# Patient Record
Sex: Female | Born: 1975
Health system: Southern US, Community
[De-identification: ages and names within clinical notes are randomized; demographics above are authoritative.]

## PROBLEM LIST (undated history)

## (undated) DIAGNOSIS — Z87898 Personal history of other specified conditions: Secondary | ICD-10-CM

## (undated) DIAGNOSIS — R5383 Other fatigue: Secondary | ICD-10-CM

## (undated) DIAGNOSIS — B0089 Other herpesviral infection: Secondary | ICD-10-CM

## (undated) DIAGNOSIS — F419 Anxiety disorder, unspecified: Secondary | ICD-10-CM

## (undated) DIAGNOSIS — E559 Vitamin D deficiency, unspecified: Secondary | ICD-10-CM

## (undated) DIAGNOSIS — R7303 Prediabetes: Secondary | ICD-10-CM

## (undated) HISTORY — DX: Personal history of other specified conditions: Z87.898

## (undated) HISTORY — DX: Other herpesviral infection: B00.89

## (undated) HISTORY — DX: Vitamin D deficiency, unspecified: E55.9

## (undated) HISTORY — DX: Other fatigue: R53.83

## (undated) HISTORY — DX: Anxiety disorder, unspecified: F41.9

## (undated) HISTORY — DX: Prediabetes: R73.03

## (undated) HISTORY — PX: BREAST REDUCTION SURGERY: SHX8

---

## 1997-10-15 ENCOUNTER — Other Ambulatory Visit: Admission: RE | Admit: 1997-10-15 | Discharge: 1997-10-15 | Payer: Self-pay | Admitting: Obstetrics and Gynecology

## 2000-01-20 HISTORY — PX: LEEP: SHX91

## 2000-02-23 ENCOUNTER — Other Ambulatory Visit: Admission: RE | Admit: 2000-02-23 | Discharge: 2000-02-23 | Payer: Self-pay | Admitting: Internal Medicine

## 2000-03-11 ENCOUNTER — Other Ambulatory Visit: Admission: RE | Admit: 2000-03-11 | Discharge: 2000-04-02 | Payer: Self-pay

## 2000-04-01 ENCOUNTER — Other Ambulatory Visit: Admission: RE | Admit: 2000-04-01 | Discharge: 2000-04-01 | Payer: Self-pay | Admitting: *Deleted

## 2000-04-01 ENCOUNTER — Encounter (INDEPENDENT_AMBULATORY_CARE_PROVIDER_SITE_OTHER): Payer: Self-pay

## 2000-04-23 ENCOUNTER — Encounter (INDEPENDENT_AMBULATORY_CARE_PROVIDER_SITE_OTHER): Payer: Self-pay

## 2000-04-23 ENCOUNTER — Other Ambulatory Visit: Admission: RE | Admit: 2000-04-23 | Discharge: 2000-04-23 | Payer: Self-pay | Admitting: *Deleted

## 2000-07-31 ENCOUNTER — Other Ambulatory Visit: Admission: RE | Admit: 2000-07-31 | Discharge: 2000-07-31 | Payer: Self-pay | Admitting: *Deleted

## 2000-11-29 ENCOUNTER — Other Ambulatory Visit: Admission: RE | Admit: 2000-11-29 | Discharge: 2000-11-29 | Payer: Self-pay | Admitting: Obstetrics & Gynecology

## 2001-04-18 ENCOUNTER — Other Ambulatory Visit: Admission: RE | Admit: 2001-04-18 | Discharge: 2001-04-18 | Payer: Self-pay | Admitting: Obstetrics & Gynecology

## 2001-07-06 ENCOUNTER — Other Ambulatory Visit: Admission: RE | Admit: 2001-07-06 | Discharge: 2001-07-06 | Payer: Self-pay | Admitting: Obstetrics & Gynecology

## 2001-11-17 ENCOUNTER — Other Ambulatory Visit: Admission: RE | Admit: 2001-11-17 | Discharge: 2001-11-17 | Payer: Self-pay | Admitting: Obstetrics & Gynecology

## 2002-01-08 ENCOUNTER — Inpatient Hospital Stay (HOSPITAL_COMMUNITY): Admission: AD | Admit: 2002-01-08 | Discharge: 2002-01-08 | Payer: Self-pay | Admitting: Obstetrics & Gynecology

## 2002-01-15 ENCOUNTER — Inpatient Hospital Stay (HOSPITAL_COMMUNITY): Admission: AD | Admit: 2002-01-15 | Discharge: 2002-01-17 | Payer: Self-pay | Admitting: Obstetrics and Gynecology

## 2002-02-16 ENCOUNTER — Other Ambulatory Visit: Admission: RE | Admit: 2002-02-16 | Discharge: 2002-02-16 | Payer: Self-pay | Admitting: Obstetrics & Gynecology

## 2002-11-28 ENCOUNTER — Other Ambulatory Visit: Admission: RE | Admit: 2002-11-28 | Discharge: 2002-11-28 | Payer: Self-pay | Admitting: Obstetrics & Gynecology

## 2003-05-21 ENCOUNTER — Other Ambulatory Visit: Admission: RE | Admit: 2003-05-21 | Discharge: 2003-05-21 | Payer: Self-pay | Admitting: *Deleted

## 2003-12-18 ENCOUNTER — Ambulatory Visit (HOSPITAL_COMMUNITY): Admission: RE | Admit: 2003-12-18 | Discharge: 2003-12-18 | Payer: Self-pay | Admitting: Obstetrics & Gynecology

## 2003-12-19 ENCOUNTER — Inpatient Hospital Stay (HOSPITAL_COMMUNITY): Admission: AD | Admit: 2003-12-19 | Discharge: 2003-12-22 | Payer: Self-pay | Admitting: Obstetrics and Gynecology

## 2005-02-10 ENCOUNTER — Other Ambulatory Visit: Admission: RE | Admit: 2005-02-10 | Discharge: 2005-02-10 | Payer: Self-pay | Admitting: Internal Medicine

## 2006-02-25 ENCOUNTER — Other Ambulatory Visit: Admission: RE | Admit: 2006-02-25 | Discharge: 2006-02-25 | Payer: Self-pay | Admitting: Internal Medicine

## 2007-07-28 ENCOUNTER — Other Ambulatory Visit: Admission: RE | Admit: 2007-07-28 | Discharge: 2007-07-28 | Payer: Self-pay | Admitting: Internal Medicine

## 2008-01-16 ENCOUNTER — Ambulatory Visit: Payer: Self-pay | Admitting: Internal Medicine

## 2008-08-26 ENCOUNTER — Inpatient Hospital Stay (HOSPITAL_COMMUNITY): Admission: AD | Admit: 2008-08-26 | Discharge: 2008-08-26 | Payer: Self-pay | Admitting: Obstetrics

## 2008-09-28 ENCOUNTER — Inpatient Hospital Stay (HOSPITAL_COMMUNITY): Admission: AD | Admit: 2008-09-28 | Discharge: 2008-09-28 | Payer: Self-pay | Admitting: Obstetrics & Gynecology

## 2008-10-07 ENCOUNTER — Inpatient Hospital Stay (HOSPITAL_COMMUNITY): Admission: AD | Admit: 2008-10-07 | Discharge: 2008-10-10 | Payer: Self-pay | Admitting: Obstetrics & Gynecology

## 2008-10-11 ENCOUNTER — Encounter: Admission: RE | Admit: 2008-10-11 | Discharge: 2008-11-09 | Payer: Self-pay | Admitting: Obstetrics & Gynecology

## 2008-11-10 ENCOUNTER — Encounter: Admission: RE | Admit: 2008-11-10 | Discharge: 2008-12-10 | Payer: Self-pay | Admitting: Obstetrics & Gynecology

## 2008-12-11 ENCOUNTER — Encounter: Admission: RE | Admit: 2008-12-11 | Discharge: 2009-01-09 | Payer: Self-pay | Admitting: Obstetrics & Gynecology

## 2009-01-10 ENCOUNTER — Encounter: Admission: RE | Admit: 2009-01-10 | Discharge: 2009-01-17 | Payer: Self-pay | Admitting: Obstetrics & Gynecology

## 2009-02-10 ENCOUNTER — Encounter: Admission: RE | Admit: 2009-02-10 | Discharge: 2009-03-12 | Payer: Self-pay | Admitting: Obstetrics & Gynecology

## 2009-03-13 ENCOUNTER — Encounter: Admission: RE | Admit: 2009-03-13 | Discharge: 2009-04-12 | Payer: Self-pay | Admitting: Obstetrics & Gynecology

## 2009-05-11 ENCOUNTER — Encounter: Admission: RE | Admit: 2009-05-11 | Discharge: 2009-06-10 | Payer: Self-pay | Admitting: Obstetrics & Gynecology

## 2009-06-11 ENCOUNTER — Encounter: Admission: RE | Admit: 2009-06-11 | Discharge: 2009-07-11 | Payer: Self-pay | Admitting: Obstetrics & Gynecology

## 2009-07-12 ENCOUNTER — Encounter: Admission: RE | Admit: 2009-07-12 | Discharge: 2009-07-18 | Payer: Self-pay | Admitting: Obstetrics & Gynecology

## 2009-08-12 ENCOUNTER — Encounter: Admission: RE | Admit: 2009-08-12 | Discharge: 2009-09-11 | Payer: Self-pay | Admitting: Obstetrics & Gynecology

## 2009-09-12 ENCOUNTER — Encounter: Admission: RE | Admit: 2009-09-12 | Discharge: 2009-10-08 | Payer: Self-pay | Admitting: Obstetrics & Gynecology

## 2010-01-24 ENCOUNTER — Ambulatory Visit
Admission: RE | Admit: 2010-01-24 | Discharge: 2010-01-24 | Payer: Self-pay | Source: Home / Self Care | Attending: Internal Medicine | Admitting: Internal Medicine

## 2010-04-25 LAB — CBC
HCT: 31.2 % — ABNORMAL LOW (ref 36.0–46.0)
HCT: 37.2 % (ref 36.0–46.0)
Hemoglobin: 10.8 g/dL — ABNORMAL LOW (ref 12.0–15.0)
Hemoglobin: 12.5 g/dL (ref 12.0–15.0)
MCHC: 33.6 g/dL (ref 30.0–36.0)
MCHC: 34.7 g/dL (ref 30.0–36.0)
MCV: 95 fL (ref 78.0–100.0)
MCV: 95.7 fL (ref 78.0–100.0)
Platelets: 150 10*3/uL (ref 150–400)
Platelets: 187 10*3/uL (ref 150–400)
RBC: 3.26 MIL/uL — ABNORMAL LOW (ref 3.87–5.11)
RBC: 3.92 MIL/uL (ref 3.87–5.11)
RDW: 13.9 % (ref 11.5–15.5)
RDW: 14.2 % (ref 11.5–15.5)
WBC: 10.2 10*3/uL (ref 4.0–10.5)
WBC: 11.1 10*3/uL — ABNORMAL HIGH (ref 4.0–10.5)

## 2010-04-25 LAB — RPR: RPR Ser Ql: NONREACTIVE

## 2010-04-26 LAB — POCT PREGNANCY, URINE: Preg Test, Ur: POSITIVE

## 2010-05-05 ENCOUNTER — Encounter (INDEPENDENT_AMBULATORY_CARE_PROVIDER_SITE_OTHER): Payer: 59 | Admitting: Internal Medicine

## 2010-05-05 DIAGNOSIS — Z0000 Encounter for general adult medical examination without abnormal findings: Secondary | ICD-10-CM

## 2013-01-24 ENCOUNTER — Ambulatory Visit (INDEPENDENT_AMBULATORY_CARE_PROVIDER_SITE_OTHER): Payer: BC Managed Care – PPO | Admitting: Internal Medicine

## 2013-01-24 ENCOUNTER — Encounter: Payer: Self-pay | Admitting: Internal Medicine

## 2013-01-24 VITALS — BP 116/82 | HR 84 | Temp 100.5°F | Ht 64.0 in | Wt 164.0 lb

## 2013-01-24 DIAGNOSIS — B349 Viral infection, unspecified: Secondary | ICD-10-CM

## 2013-01-24 DIAGNOSIS — J209 Acute bronchitis, unspecified: Secondary | ICD-10-CM

## 2013-01-24 DIAGNOSIS — B9789 Other viral agents as the cause of diseases classified elsewhere: Secondary | ICD-10-CM

## 2013-01-24 MED ORDER — HYDROCODONE-HOMATROPINE 5-1.5 MG/5ML PO SYRP: 5.0000 mL | ORAL_SOLUTION | Freq: Three times a day (TID) | ORAL | Status: DC | PRN

## 2013-01-24 MED ORDER — AZITHROMYCIN 250 MG PO TABS: ORAL_TABLET | ORAL | Status: DC

## 2013-01-24 NOTE — Patient Instructions (Signed)
Take Z-Pak as directed. Take Hycodan 1 teaspoon every 8 hours as needed for cough.

## 2013-01-24 NOTE — Progress Notes (Signed)
   Subjective:    Patient ID: Brooke Bryan, female    DOB: 10/01/1975, 38 y.o.   MRN: [id number]  HPI Patient not seen since April 2012. She's been a patient here since she was 38 years old. Her daughter and 2 sons and had influenza symptoms. 36 year old son has had pneumonia. Patient has come down with flulike symptoms on January 2 with chills and myalgias. Has developed cough and congestion. Currently not on oral contraceptives. Has GYN physician. No documented fever at home. Children have been on Tamiflu. 2 sons tested positive for influenza A. Youngest son had had flu mist. Patient does not smoke. No chronic illnesses.    Review of Systems     Objective:   Physical Exam Skin: Pale warm and dry. Nodes none. Pharynx slightly injected. TMs are clear. Neck is supple without adenopathy. Chest clear.        Assessment & Plan:  Acute bronchitis  Viral syndrome-likely has had influenza A given exposure to her children. She did not take influenza immunization.  Plan: Zithromax Z-Pak take 2 tablets day one followed by 1 tablet days 2 through 5. Hycodan 8 ounces 1 teaspoon by mouth every 8 hours when necessary cough. Call if not better in 10 days.

## 2014-06-05 ENCOUNTER — Encounter: Payer: Self-pay | Admitting: Internal Medicine

## 2014-06-05 ENCOUNTER — Ambulatory Visit (INDEPENDENT_AMBULATORY_CARE_PROVIDER_SITE_OTHER): Payer: BLUE CROSS/BLUE SHIELD | Admitting: Internal Medicine

## 2014-06-05 VITALS — BP 112/78 | HR 72 | Temp 98.1°F | Ht 64.0 in | Wt 164.5 lb

## 2014-06-05 DIAGNOSIS — H8110 Benign paroxysmal vertigo, unspecified ear: Secondary | ICD-10-CM | POA: Diagnosis not present

## 2014-06-05 MED ORDER — AZITHROMYCIN 250 MG PO TABS: ORAL_TABLET | ORAL | Status: DC

## 2014-06-05 NOTE — Progress Notes (Signed)
   Subjective:    Patient ID: Brooke Bryan, female    DOB: 09/14/1975, 39 y.o.   MRN: [id number]  HPI  39 year old Female in today with complaint of URI symptoms around April 21. Since then has felt dizzy at times. Seems to be triggered by riding in an elevator or sometimes for no good reason. Feels like she is on a boat. She is concerned because her grandmother has a history of Mnire's disease. She has no ringing in her ears. No hearing loss. Ears do not feel stopped up but pop a little bit.    Review of Systems     Objective:   Physical Exam  TM slightly dull bilaterally but not red or full. Pharynx is clear. Neck is supple. Chest clear to auscultation. No nystagmus.      Assessment & Plan:  Probable benign positional vertigo which could've been aggravated by recent upper respiratory infection  Plan: Patient purchase over-the-counter meclizine 25 mg and take it at bedtime for week. Prescribed Zithromax Z-Pak.

## 2014-06-05 NOTE — Patient Instructions (Signed)
Take meclizine 25 mg at bedtime for one week. Take Zithromax Z-Pak. Call if symptoms persist after 2 weeks.

## 2014-07-12 ENCOUNTER — Other Ambulatory Visit: Payer: Self-pay | Admitting: Internal Medicine

## 2014-07-12 ENCOUNTER — Other Ambulatory Visit: Payer: BLUE CROSS/BLUE SHIELD | Admitting: Internal Medicine

## 2014-07-12 DIAGNOSIS — Z0000 Encounter for general adult medical examination without abnormal findings: Secondary | ICD-10-CM

## 2014-07-12 DIAGNOSIS — Z1321 Encounter for screening for nutritional disorder: Secondary | ICD-10-CM

## 2014-07-12 DIAGNOSIS — Z1329 Encounter for screening for other suspected endocrine disorder: Secondary | ICD-10-CM

## 2014-07-12 DIAGNOSIS — Z13220 Encounter for screening for lipoid disorders: Secondary | ICD-10-CM

## 2014-07-12 DIAGNOSIS — Z130 Encounter for screening for diseases of the blood and blood-forming organs and certain disorders involving the immune mechanism: Secondary | ICD-10-CM

## 2014-07-12 LAB — CBC WITH DIFFERENTIAL/PLATELET
Basophils Absolute: 0.1 10*3/uL (ref 0.0–0.1)
Basophils Relative: 1 % (ref 0–1)
Eosinophils Absolute: 0.1 10*3/uL (ref 0.0–0.7)
Eosinophils Relative: 1 % (ref 0–5)
HCT: 42.2 % (ref 36.0–46.0)
Hemoglobin: 14 g/dL (ref 12.0–15.0)
Lymphocytes Relative: 37 % (ref 12–46)
Lymphs Abs: 2.6 10*3/uL (ref 0.7–4.0)
MCH: 29.4 pg (ref 26.0–34.0)
MCHC: 33.2 g/dL (ref 30.0–36.0)
MCV: 88.5 fL (ref 78.0–100.0)
MPV: 9.6 fL (ref 8.6–12.4)
Monocytes Absolute: 0.4 10*3/uL (ref 0.1–1.0)
Monocytes Relative: 6 % (ref 3–12)
Neutro Abs: 3.9 10*3/uL (ref 1.7–7.7)
Neutrophils Relative %: 55 % (ref 43–77)
Platelets: 235 10*3/uL (ref 150–400)
RBC: 4.77 MIL/uL (ref 3.87–5.11)
RDW: 13.5 % (ref 11.5–15.5)
WBC: 7 10*3/uL (ref 4.0–10.5)

## 2014-07-12 LAB — COMPLETE METABOLIC PANEL WITH GFR
ALT: 12 U/L (ref 0–35)
AST: 15 U/L (ref 0–37)
Albumin: 4.5 g/dL (ref 3.5–5.2)
Alkaline Phosphatase: 56 U/L (ref 39–117)
BUN: 15 mg/dL (ref 6–23)
CO2: 27 mEq/L (ref 19–32)
Calcium: 9.4 mg/dL (ref 8.4–10.5)
Chloride: 102 mEq/L (ref 96–112)
Creat: 0.7 mg/dL (ref 0.50–1.10)
GFR, Est African American: >89 mL/min
GFR, Est Non African American: >89 mL/min
Glucose, Bld: 93 mg/dL (ref 70–99)
Potassium: 4.5 mEq/L (ref 3.5–5.3)
Sodium: 137 mEq/L (ref 135–145)
Total Bilirubin: 0.7 mg/dL (ref 0.2–1.2)
Total Protein: 7.3 g/dL (ref 6.0–8.3)

## 2014-07-12 LAB — LIPID PANEL
Cholesterol: 193 mg/dL (ref 0–200)
HDL: 59 mg/dL (ref >=46)
LDL Cholesterol: 96 mg/dL (ref 0–99)
Total CHOL/HDL Ratio: 3.3 Ratio
Triglycerides: 188 mg/dL — ABNORMAL HIGH (ref <150)
VLDL: 38 mg/dL (ref 0–40)

## 2014-07-12 LAB — TSH: TSH: 1.634 u[IU]/mL (ref 0.350–4.500)

## 2014-07-13 LAB — VITAMIN D 25 HYDROXY (VIT D DEFICIENCY, FRACTURES): Vit D, 25-Hydroxy: 24 ng/mL — ABNORMAL LOW (ref 30–100)

## 2014-07-17 ENCOUNTER — Ambulatory Visit (INDEPENDENT_AMBULATORY_CARE_PROVIDER_SITE_OTHER): Payer: BLUE CROSS/BLUE SHIELD | Admitting: Internal Medicine

## 2014-07-17 ENCOUNTER — Encounter: Payer: Self-pay | Admitting: Internal Medicine

## 2014-07-17 VITALS — BP 108/72 | HR 88 | Temp 98.2°F | Ht 64.0 in | Wt 169.0 lb

## 2014-07-17 DIAGNOSIS — Z0000 Encounter for general adult medical examination without abnormal findings: Secondary | ICD-10-CM

## 2014-07-17 DIAGNOSIS — Z9889 Other specified postprocedural states: Secondary | ICD-10-CM | POA: Diagnosis not present

## 2014-07-17 DIAGNOSIS — E781 Pure hyperglyceridemia: Secondary | ICD-10-CM | POA: Diagnosis not present

## 2014-07-17 DIAGNOSIS — E669 Obesity, unspecified: Secondary | ICD-10-CM | POA: Diagnosis not present

## 2014-07-17 DIAGNOSIS — B009 Herpesviral infection, unspecified: Secondary | ICD-10-CM

## 2014-07-17 LAB — POCT URINALYSIS DIPSTICK
Bilirubin, UA: NEGATIVE
Blood, UA: NEGATIVE
Glucose, UA: NEGATIVE
Ketones, UA: NEGATIVE
Leukocytes, UA: NEGATIVE
Nitrite, UA: NEGATIVE
Protein, UA: NEGATIVE
Spec Grav, UA: <=1.005
Urobilinogen, UA: NEGATIVE
pH, UA: 8

## 2014-07-17 LAB — HEMOGLOBIN A1C
Hgb A1c MFr Bld: 5.7 % — ABNORMAL HIGH (ref <5.7)
Mean Plasma Glucose: 117 mg/dL — ABNORMAL HIGH (ref <117)

## 2014-07-17 NOTE — Progress Notes (Signed)
   Subjective:    Patient ID: Brooke Bryan, female    DOB: 09/10/1975, 39 y.o.   MRN: [id number]  HPI 39 year old white female has been a patient in this practice since she was 39 years old. History of chickenpox 61986. Recurrent urinary tract infections in childhood. LEEP procedure by Dr. Delia HeadyWes Bryan in 2002 for abnormal Pap smear. History of HSV type II.  Dr. Seymour Bryan is her GYN physician. Had tetanus immunization is GYN in 2011.  Had episode of vertigo May 2016 that resolved with conservative therapy.  No known drug allergies  Social history: She is married. She attended [employer] and majored in Chief Financial Officermarketing. Her father previously on try gone. It was old in 2009. She was laid-off and subsequently began working in [employer] for [employer]. 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 Does not smoke. Social alcohol consumption. She has 3 children. Son has been diagnosed with translocation of chromosomes 1 and 2427. Apparently Brooke Bryan's husband has a balanced translocation. One daughter age 37. Sons are ages 3212 and 955.  Family history: Father has been diagnosed with hyperlipidemia and hypertension. Father  had hip replacement. One brother in good health. Mother living in good health.  Patient is overweight. Doesn't get as much exercise as she would like to.    Review of Systems  Constitutional: Negative.   All other systems reviewed and are negative.      Objective:   Physical Exam  Constitutional: She is oriented to person, place, and time. She appears well-developed and well-nourished. No distress.  HENT:  Head: Normocephalic and atraumatic.  Right Ear: External ear normal.  Left Ear: External ear normal.  Mouth/Throat: Oropharynx is clear and moist. No oropharyngeal exudate.  Eyes: Conjunctivae and EOM are normal. Pupils are equal, round, and reactive to light. Right eye exhibits no discharge. Left eye exhibits no discharge. No scleral  icterus.  Neck: Normal range of motion. Neck supple. No JVD present. No thyromegaly present.  Cardiovascular: Normal rate, regular rhythm and normal heart sounds.   No murmur heard. Pulmonary/Chest: Effort normal and breath sounds normal. She has no wheezes. She has no rales.  Breasts normal female without masses  Abdominal: Soft. Bowel sounds are normal. She exhibits no distension and no mass. There is no tenderness. There is no rebound and no guarding.  Genitourinary:  Deferred to GYN  Musculoskeletal: She exhibits no edema.  Neurological: She is alert and oriented to person, place, and time. She has normal reflexes. No cranial nerve deficit.  Skin: Skin is warm and dry. No rash noted. She is not diaphoretic.  Psychiatric: She has a normal mood and affect. Her behavior is normal. Thought content normal.  Vitals reviewed.         Assessment & Plan:  Obesity-she tried phentermine sometime back and lost weight but then regained it. Did not like the way it made her feel. Were going to try Tenuate 75 mg 1 by mouth at 10 AM as an appetite suppressant. Given #30 with 3 refills.  Hypertriglyceridemia-needs to diet exercise and lose weight and recheck in one year. Triglycerides are 188. Screen for diabetes with elevated triglycerides. Hemoglobin A1c pending.  Health maintenance-tetanus immunization up-to-date. To see GYN physician soon.  History of LEEP procedure for abnormal Pap smear  History of HSV type II  Plan: Return in one year or as needed. Tenuate is appetite suppressant. Encouraged diet exercise and weight loss.

## 2014-07-17 NOTE — Patient Instructions (Addendum)
It was a pleasure to see you today. Please try to diet and exercise. Triglycerides are elevated at 188. Tetanus immunization is up-to-date having been given in 2011. Return in one year or as needed. Try Tenuate for appetite suppression.

## 2014-07-18 ENCOUNTER — Telehealth: Payer: Self-pay | Admitting: *Deleted

## 2014-08-01 NOTE — Telephone Encounter (Signed)
Have made numerous attempts to contact patient to review lab work . Patient has not returned calls.

## 2015-01-10 ENCOUNTER — Encounter: Payer: Self-pay | Admitting: Internal Medicine

## 2015-01-10 ENCOUNTER — Ambulatory Visit (INDEPENDENT_AMBULATORY_CARE_PROVIDER_SITE_OTHER): Payer: BLUE CROSS/BLUE SHIELD | Admitting: Internal Medicine

## 2015-01-10 VITALS — BP 112/78 | HR 104 | Temp 99.7°F | Resp 20 | Ht 64.0 in | Wt 165.0 lb

## 2015-01-10 DIAGNOSIS — R509 Fever, unspecified: Secondary | ICD-10-CM | POA: Diagnosis not present

## 2015-01-10 DIAGNOSIS — J029 Acute pharyngitis, unspecified: Secondary | ICD-10-CM | POA: Diagnosis not present

## 2015-01-10 LAB — POCT RAPID STREP A (OFFICE): Rapid Strep A Screen: NEGATIVE

## 2015-01-10 MED ORDER — CLARITHROMYCIN 500 MG PO TABS: 500.0000 mg | ORAL_TABLET | Freq: Two times a day (BID) | ORAL | Status: DC

## 2015-01-10 MED ORDER — HYDROCODONE-ACETAMINOPHEN 5-325 MG PO TABS: 1.0000 | ORAL_TABLET | Freq: Four times a day (QID) | ORAL | Status: DC | PRN

## 2015-01-10 NOTE — Patient Instructions (Addendum)
Take Norco as needed for sore throat pain. Take Biaxin 500 mg twice daily x 10 days.

## 2015-01-10 NOTE — Progress Notes (Signed)
   Subjective:    Patient ID: Brooke Bryan, female    DOB: 04/13/1975, 39 y.o.   MRN: [id number]  HPI 39 year old Female in today with sore throat and fever and congestion. Children have been ill. Has malaise and fatigue. Some cough.    Review of Systems     Objective:   Physical Exam  Skin warm and dry. Nodes none. Pharynx injected. No exudate noted. TMs are slightly full and pink bilaterally. Neck is supple. Chest clear to auscultation. Rapid strep screen is negative.      Assessment & Plan:  Acute pharyngitis  Fever  Plan: Biaxin 500 mg twice daily for 10 days. Take with food. Norco 5/325 one by mouth every 8 hours when necessary sore throat pain. Rest and drink plenty of fluids.

## 2015-01-16 ENCOUNTER — Encounter: Payer: Self-pay | Admitting: Internal Medicine

## 2015-04-10 ENCOUNTER — Telehealth: Payer: Self-pay

## 2015-04-10 NOTE — Telephone Encounter (Signed)
Patient contacted office stating that she has been seen by you before for her feeling off balance (swaying like she is on a boat). She states that when she takes dramamine for it that it knocks her out and she cannot function. She states that she is not nauseated with it and doesn't feel like she is going to fall. She is hesitant to use the term dizzy as she is not dizzy persay, just more off balance. She reports a "chilled" sensation up her scalp at times. She wonders if she needs to be evaluated at ENT or with you. I have penciled her in for tomorrow at 3:45 pending your advisement.

## 2015-04-10 NOTE — Telephone Encounter (Signed)
OK  To see tomorrow--she was seen May 2016 for vertigo associated with URI  and in December for pharyngitis with no mention of vertigo

## 2015-04-11 ENCOUNTER — Ambulatory Visit: Payer: BLUE CROSS/BLUE SHIELD | Admitting: Internal Medicine

## 2015-04-12 ENCOUNTER — Encounter: Payer: Self-pay | Admitting: Internal Medicine

## 2015-04-12 ENCOUNTER — Ambulatory Visit (INDEPENDENT_AMBULATORY_CARE_PROVIDER_SITE_OTHER): Payer: Managed Care, Other (non HMO) | Admitting: Internal Medicine

## 2015-04-12 VITALS — Temp 98.0°F | Resp 18 | Ht 64.0 in | Wt 172.0 lb

## 2015-04-12 DIAGNOSIS — H8110 Benign paroxysmal vertigo, unspecified ear: Secondary | ICD-10-CM | POA: Diagnosis not present

## 2015-04-12 MED ORDER — ALPRAZOLAM 0.25 MG PO TABS: 0.2500 mg | ORAL_TABLET | Freq: Two times a day (BID) | ORAL | Status: DC | PRN

## 2015-04-12 MED ORDER — AMOXICILLIN 500 MG PO CAPS: 500.0000 mg | ORAL_CAPSULE | Freq: Three times a day (TID) | ORAL | Status: DC

## 2015-04-12 MED ORDER — METHYLPREDNISOLONE ACETATE 80 MG/ML IJ SUSP
80.0000 mg | Freq: Once | INTRAMUSCULAR | Status: AC
  Administered 2015-04-12: 80 mg via INTRAMUSCULAR

## 2015-04-15 ENCOUNTER — Telehealth: Payer: Self-pay

## 2015-04-15 NOTE — Telephone Encounter (Signed)
Patient contacted office and states that she feels like the dizziness is worse. She cannot take the xanax due to it making her very drowsy. She is taking the amoxicillin and she states that if anything her ear is a little worse. She states that she feels like the rocking is worse. CB# [phone]

## 2015-04-15 NOTE — Telephone Encounter (Signed)
Try breaking Xanax in half. May need to stay home from work. See if ENT can see.

## 2015-04-17 NOTE — Patient Instructions (Addendum)
Takes Xanax 0.25 mg twice daily until symptoms resolve.

## 2015-04-17 NOTE — Progress Notes (Signed)
   Subjective:    Patient ID: Carmela RimaJessica W Garbett, female    DOB: 02/15/1975, 40 y.o.   MRN: [id number]  HPI 40 year old White Female in today for complaint of dizziness which she describes as feeling like she is rocking on a boat. Symptoms been present for several days. She tried meclizine but that made her quite drowsy. No nausea and vomiting. Has some situational stress with job. Not enough help at her job. She works in Therapist, sportsproperty management. Does not smoke. Social alcohol consumption. She has 3 children. Son has been diagnosed with translocation of chromosomes 1 and 4227. Admits that she could be anxious.    Review of Systems     Objective:   Physical Exam  Blood pressure lying is 122/70 with pulse of 70 blood pressure sitting is 122/72 with pulse of 81 blood pressure standing is 122/72 with pulse of 92. She is afebrile. No nystagmus. Funduscopic exam is benign. PERRLA. Muscle strength and deep tendon reflexes normal. Gait is normal. TMs are clear. Pharynx is clear. Neck is supple without thyromegaly. Chest clear to auscultation. Cardiac exam regular rate and rhythm normal S1 and S2. No focal deficits on brief neurological exam      Assessment & Plan:  Probable benign positional vertigo  Plan: Since meclizine is causing a lot of sedation, suggest she try small dose of Xanax 0.25 mg twice daily. She was reassured symptoms will likely go away within 10 days. Xanax will also help with anxiety.

## 2015-08-05 ENCOUNTER — Ambulatory Visit (INDEPENDENT_AMBULATORY_CARE_PROVIDER_SITE_OTHER): Payer: Managed Care, Other (non HMO) | Admitting: Internal Medicine

## 2015-08-05 ENCOUNTER — Encounter: Payer: Self-pay | Admitting: Internal Medicine

## 2015-08-05 VITALS — BP 124/74 | HR 98 | Temp 99.5°F | Resp 16 | Ht 64.0 in | Wt 172.5 lb

## 2015-08-05 DIAGNOSIS — J029 Acute pharyngitis, unspecified: Secondary | ICD-10-CM | POA: Diagnosis not present

## 2015-08-05 DIAGNOSIS — J020 Streptococcal pharyngitis: Secondary | ICD-10-CM

## 2015-08-05 MED ORDER — HYDROCODONE-HOMATROPINE 5-1.5 MG/5ML PO SYRP: 5.0000 mL | ORAL_SOLUTION | Freq: Three times a day (TID) | ORAL | Status: DC | PRN

## 2015-08-05 MED ORDER — CEFTRIAXONE SODIUM 1 G IJ SOLR
1.0000 g | INTRAMUSCULAR | Status: AC
  Administered 2015-08-05: 1 g via INTRAMUSCULAR

## 2015-08-05 NOTE — Patient Instructions (Signed)
Continue Pen-Vee K to complete ten-day course. 1 g IM Rocephin given today. Hycodan 1 teaspoon by mouth every 8 hours with food when necessary pain and headache. Call if not better by Wednesday, July 19.

## 2015-08-05 NOTE — Progress Notes (Signed)
   Subjective:    Patient ID: Brooke Bryan, female    DOB: 03/24/1975, 40 y.o.   MRN: [id number]  HPI 40 year old White Female in today having been diagnosed with strep throat at an urgent care on Friday evening July 14. Was treated with Pen-Vee K. Says she's not gotten a lot better. Has headache and sore throat and malaise and fatigue. Her son was just found to be a Bryan of strep. She's been compliant with her medication.    Review of Systems see above     Objective:   Physical Exam  Skin warm and dry. She has exudates on both tonsils. TMs are slightly dull bilaterally but not red. Neck is supple. Chest clear to auscultation. No significant anterior cervical adenopathy.      Assessment & Plan:  Exudative pharyngitis-diagnosed with strep  Plan: Continue with Pen-Vee K to complete ten-day course. Call if not better by Wednesday, July 19. 1 g IM Rocephin given today. Hycodan 1 teaspoon by mouth every 8 hours when necessary headache and pain. Patient requests liquid instead of tablets for pain. Rest and drink plenty of fluids.

## 2016-04-10 ENCOUNTER — Ambulatory Visit (INDEPENDENT_AMBULATORY_CARE_PROVIDER_SITE_OTHER): Payer: Managed Care, Other (non HMO) | Admitting: Internal Medicine

## 2016-04-10 ENCOUNTER — Encounter: Payer: Self-pay | Admitting: Internal Medicine

## 2016-04-10 VITALS — BP 102/88 | HR 97 | Temp 101.8°F | Ht 64.0 in | Wt 176.0 lb

## 2016-04-10 DIAGNOSIS — R509 Fever, unspecified: Secondary | ICD-10-CM | POA: Diagnosis not present

## 2016-04-10 DIAGNOSIS — R52 Pain, unspecified: Secondary | ICD-10-CM | POA: Diagnosis not present

## 2016-04-10 DIAGNOSIS — J029 Acute pharyngitis, unspecified: Secondary | ICD-10-CM

## 2016-04-10 DIAGNOSIS — J020 Streptococcal pharyngitis: Secondary | ICD-10-CM | POA: Diagnosis not present

## 2016-04-10 DIAGNOSIS — R6883 Chills (without fever): Secondary | ICD-10-CM

## 2016-04-10 LAB — POCT RAPID STREP A (OFFICE): Rapid Strep A Screen: POSITIVE — AB

## 2016-04-10 MED ORDER — CEFTRIAXONE SODIUM 1 G IJ SOLR
1.0000 g | Freq: Once | INTRAMUSCULAR | Status: AC
  Administered 2016-04-10: 1 g via INTRAMUSCULAR

## 2016-04-10 MED ORDER — AMOXICILLIN-POT CLAVULANATE 875-125 MG PO TABS: 1.0000 | ORAL_TABLET | Freq: Two times a day (BID) | ORAL | 0 refills | Status: DC

## 2016-04-10 MED ORDER — HYDROCODONE-HOMATROPINE 5-1.5 MG/5ML PO SYRP: 5.0000 mL | ORAL_SOLUTION | Freq: Three times a day (TID) | ORAL | 0 refills | Status: DC | PRN

## 2016-04-10 NOTE — Progress Notes (Signed)
   Subjective:    Patient ID: Brooke Bryan, female    DOB: 01/18/1976, 41 y.o.   MRN: [id number]  HPI 41 year old Female in today with sore throat symptoms. She's had chills fever and myalgias. She had E visit recently through her insurance company. Dr. placed her on amoxicillin 875 mg twice daily. Date of  prescription in Epic is March 21. It's been about 2- 3 days or so and she hasn't gotten better. Says her son was diagnosed as being a strep carrier previously.          Review of Systems see above     Objective:   Physical Exam She has bilateral exudative pharyngitis both tonsils. Rapid strep screen is positive. TMs are full and pink. Neck is supple. No significant adenopathy. Chest clear to auscultation.       Assessment & Plan:  Streptococcal pharyngitis  Plan: Hycodan 1 teaspoon by mouth every 8 hours when necessary cough and sore throat pain. Augmentin 875 mg twice daily for 7 days. Rocephin 1 g IM.

## 2016-04-10 NOTE — Patient Instructions (Signed)
Complete seven-day course of Augmentin as previously prescribed by e- visit  Physician. You have one refill on this medication. Rocephin 1 g IM given. Hycodan 1 teaspoon by mouth every 8 hours when necessary sore throat pain/cough. Tylenol as needed for headache or fever.

## 2016-04-13 ENCOUNTER — Telehealth: Payer: Self-pay

## 2016-06-03 NOTE — Telephone Encounter (Signed)
ERROR

## 2016-09-14 ENCOUNTER — Ambulatory Visit: Payer: Managed Care, Other (non HMO) | Admitting: Internal Medicine

## 2016-10-09 ENCOUNTER — Other Ambulatory Visit: Payer: Managed Care, Other (non HMO) | Admitting: Internal Medicine

## 2016-10-13 ENCOUNTER — Encounter: Payer: Managed Care, Other (non HMO) | Admitting: Internal Medicine

## 2017-01-06 ENCOUNTER — Other Ambulatory Visit: Payer: Self-pay | Admitting: Internal Medicine

## 2017-01-06 DIAGNOSIS — Z1321 Encounter for screening for nutritional disorder: Secondary | ICD-10-CM

## 2017-01-06 DIAGNOSIS — Z1329 Encounter for screening for other suspected endocrine disorder: Secondary | ICD-10-CM

## 2017-01-06 DIAGNOSIS — Z0000 Encounter for general adult medical examination without abnormal findings: Secondary | ICD-10-CM

## 2017-01-06 DIAGNOSIS — E781 Pure hyperglyceridemia: Secondary | ICD-10-CM

## 2017-01-14 ENCOUNTER — Other Ambulatory Visit: Payer: Managed Care, Other (non HMO) | Admitting: Internal Medicine

## 2017-01-14 DIAGNOSIS — E781 Pure hyperglyceridemia: Secondary | ICD-10-CM

## 2017-01-14 DIAGNOSIS — Z1329 Encounter for screening for other suspected endocrine disorder: Secondary | ICD-10-CM

## 2017-01-14 DIAGNOSIS — Z1321 Encounter for screening for nutritional disorder: Secondary | ICD-10-CM

## 2017-01-14 DIAGNOSIS — Z0000 Encounter for general adult medical examination without abnormal findings: Secondary | ICD-10-CM

## 2017-01-15 ENCOUNTER — Ambulatory Visit (INDEPENDENT_AMBULATORY_CARE_PROVIDER_SITE_OTHER): Payer: Managed Care, Other (non HMO) | Admitting: Internal Medicine

## 2017-01-15 ENCOUNTER — Encounter: Payer: Self-pay | Admitting: Internal Medicine

## 2017-01-15 ENCOUNTER — Encounter (INDEPENDENT_AMBULATORY_CARE_PROVIDER_SITE_OTHER): Payer: Self-pay

## 2017-01-15 VITALS — BP 120/80 | HR 92 | Ht 63.0 in | Wt 187.0 lb

## 2017-01-15 DIAGNOSIS — Z803 Family history of malignant neoplasm of breast: Secondary | ICD-10-CM

## 2017-01-15 DIAGNOSIS — K580 Irritable bowel syndrome with diarrhea: Secondary | ICD-10-CM | POA: Diagnosis not present

## 2017-01-15 DIAGNOSIS — Z87898 Personal history of other specified conditions: Secondary | ICD-10-CM | POA: Diagnosis not present

## 2017-01-15 DIAGNOSIS — Z6833 Body mass index (BMI) 33.0-33.9, adult: Secondary | ICD-10-CM

## 2017-01-15 DIAGNOSIS — E559 Vitamin D deficiency, unspecified: Secondary | ICD-10-CM

## 2017-01-15 DIAGNOSIS — R739 Hyperglycemia, unspecified: Secondary | ICD-10-CM | POA: Diagnosis not present

## 2017-01-15 DIAGNOSIS — Z23 Encounter for immunization: Secondary | ICD-10-CM | POA: Diagnosis not present

## 2017-01-15 DIAGNOSIS — Z0000 Encounter for general adult medical examination without abnormal findings: Secondary | ICD-10-CM

## 2017-01-15 LAB — CBC WITH DIFFERENTIAL/PLATELET
Basophils Absolute: 50 cells/uL (ref 0–200)
Basophils Relative: 0.7 %
Eosinophils Absolute: 92 cells/uL (ref 15–500)
Eosinophils Relative: 1.3 %
HCT: 41.5 % (ref 35.0–45.0)
Hemoglobin: 14.1 g/dL (ref 11.7–15.5)
Lymphs Abs: 2513 cells/uL (ref 850–3900)
MCH: 29.7 pg (ref 27.0–33.0)
MCHC: 34 g/dL (ref 32.0–36.0)
MCV: 87.4 fL (ref 80.0–100.0)
MPV: 10 fL (ref 7.5–12.5)
Monocytes Relative: 6.7 %
Neutro Abs: 3969 cells/uL (ref 1500–7800)
Neutrophils Relative %: 55.9 %
Platelets: 257 10*3/uL (ref 140–400)
RBC: 4.75 10*6/uL (ref 3.80–5.10)
RDW: 12.1 % (ref 11.0–15.0)
Total Lymphocyte: 35.4 %
WBC mixed population: 476 cells/uL (ref 200–950)
WBC: 7.1 10*3/uL (ref 3.8–10.8)

## 2017-01-15 LAB — COMPLETE METABOLIC PANEL WITH GFR
AG Ratio: 1.7 (calc) (ref 1.0–2.5)
ALT: 14 U/L (ref 6–29)
AST: 14 U/L (ref 10–30)
Albumin: 4.3 g/dL (ref 3.6–5.1)
Alkaline phosphatase (APISO): 62 U/L (ref 33–115)
BUN: 10 mg/dL (ref 7–25)
CO2: 25 mmol/L (ref 20–32)
Calcium: 9 mg/dL (ref 8.6–10.2)
Chloride: 105 mmol/L (ref 98–110)
Creat: 0.71 mg/dL (ref 0.50–1.10)
GFR, Est African American: 124 mL/min/{1.73_m2} (ref >=60)
GFR, Est Non African American: 107 mL/min/{1.73_m2} (ref >=60)
Globulin: 2.5 g/dL (calc) (ref 1.9–3.7)
Glucose, Bld: 93 mg/dL (ref 65–99)
Potassium: 4.5 mmol/L (ref 3.5–5.3)
Sodium: 137 mmol/L (ref 135–146)
Total Bilirubin: 0.5 mg/dL (ref 0.2–1.2)
Total Protein: 6.8 g/dL (ref 6.1–8.1)

## 2017-01-15 LAB — LIPID PANEL
Cholesterol: 180 mg/dL (ref <200)
HDL: 52 mg/dL (ref >50)
LDL Cholesterol (Calc): 107 mg/dL (calc) — ABNORMAL HIGH
Non-HDL Cholesterol (Calc): 128 mg/dL (calc) (ref <130)
Total CHOL/HDL Ratio: 3.5 (calc) (ref <5.0)
Triglycerides: 109 mg/dL (ref <150)

## 2017-01-15 LAB — TSH: TSH: 1.4 mIU/L

## 2017-01-15 LAB — VITAMIN D 25 HYDROXY (VIT D DEFICIENCY, FRACTURES): Vit D, 25-Hydroxy: 22 ng/mL — ABNORMAL LOW (ref 30–100)

## 2017-01-15 NOTE — Progress Notes (Signed)
Subjective:    Patient ID: Brooke Bryan, female    DOB: 1975-05-01, 41 y.o.   MRN: [id number]  HPI 41 year old Female in today for health maintenance exam and evaluation of medical issues.  She is frustrated with her weight.  Sometimes goes all sugar binges and does not stick to a strict exercise regimen.  We talked about this at length today.  She may need some help.  I recommended Dr. Migdalia Dk office to her.  Needs some accountability with her diet.  Says her mother has been diagnosed with breast cancer at age 29.  Maternal grandmother with history of breast cancer much later in life in the 78s or 2s.  She has not had a mammogram.  Will be due for one at age 51.  Dr. Dellis Filbert is her GYN physician.  History of tetanus immunization at GYN in 2011.  History of chickenpox in 32.  Recurrent urinary tract infections in childhood.  LEEP procedure by Dr. Inocencio Homes in 2002 for abnormal Pap smear.  History of HSV type II.  She has been a patient in this practice since she was 41 years old.  History of vertigo May 2016 that resolved with conservative therapy.  No known drug allergies.  Social history: She is married.  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  Her father previously worked with [employer] which was sold in 2009.  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  She now works for another Holiday representative.  Does not smoke.  Social alcohol consumption.  She has 3 children.  Son has been diagnosed with translocation of chromosomes 1 and 23.  Apparently just because husband has a balance translocation.  One daughter and 2 sons.  Family history: Mother diagnosed recently with breast cancer.  Father with history of hyperlipidemia and hypertension.  Father also has had hip arthroplasty.  One brother in good health.  Previous weight in 2016 was 169 pounds and now she weighs 187 pounds.  BMI is 33.13.    Review of Systems  HENT: Negative.   Respiratory:  Negative.   Cardiovascular: Negative.   Gastrointestinal:       Issues with irritable bowel syndrome.  Discussed today.  Handout given on irritable bowel syndrome.  Certain foods such as [place] cause diarrhea.  Genitourinary: Negative.        Objective:   Physical Exam  Constitutional: She is oriented to person, place, and time. She appears well-developed and well-nourished. No distress.  HENT:  Head: Normocephalic and atraumatic.  Right Ear: External ear normal.  Left Ear: External ear normal.  Mouth/Throat: Oropharynx is clear and moist.  Eyes: Conjunctivae and EOM are normal. Pupils are equal, round, and reactive to light. Right eye exhibits no discharge. Left eye exhibits no discharge. No scleral icterus.  Neck: Neck supple. No JVD present. No thyromegaly present.  Cardiovascular: Normal rate, regular rhythm and normal heart sounds.  No murmur heard. Pulmonary/Chest: Effort normal and breath sounds normal. No respiratory distress. She has no wheezes. She has no rales. She exhibits no tenderness.  Abdominal: Soft. Bowel sounds are normal.  Genitourinary:  Genitourinary Comments: Deferred to GYN  Musculoskeletal: She exhibits no edema.  Neurological: She is alert and oriented to person, place, and time. She has normal reflexes. No cranial nerve deficit. Coordination normal.  Skin: Skin is warm and dry. She is not diaphoretic.  Psychiatric: She has a normal mood and affect. Her behavior is normal. Judgment and thought content  normal.  Vitals reviewed.         Assessment & Plan:  BMI 33-discussion about healthy food choices, sticking to diet regimen, recommend Dr. Leafy Ro for evaluation  Irritable bowel syndrome-handout given on irritable bowel syndrome and explained certain foods that can cause IBS.  Did not prescribe medication for IBS today.  History of impaired glucose tolerance with hemoglobin A1c of 5.7 in 2016.  This was repeated today.  Vitamin D deficiency.  She  was vitamin D deficient since 2 years ago with level of 24.  Must take consistently vitamin D3 2000 units daily.  Explained benefits of vitamin D to her today.  CBC and C met are normal.  LDL is 107.  Triglycerides have improved from 188 and 216-109.  Plan: Return in 1 year or as needed.  Needs dietary consultation.  I think Dr. Migdalia Dk practice would be good for her and give her some accountability.  Handout given on irritable bowel syndrome with explanation.  If symptoms are excessive she can call and have medication prescribed.

## 2017-01-15 NOTE — Patient Instructions (Signed)
Hemoglobin A1c drawn and pending.  Flu vaccine given.  Take 2000 units vitamin D3 daily.  Watch diet and consistently exercise.  Suggest Dr. Francena HanlyBeasley's office for evaluation of obesity and accountability with food choices.  Recommend mammogram at age 41.  Irritable bowel handout given.

## 2017-01-16 LAB — HEMOGLOBIN A1C
Hgb A1c MFr Bld: 5.4 % of total Hgb (ref <5.7)
Mean Plasma Glucose: 108 (calc)
eAG (mmol/L): 6 (calc)

## 2017-01-18 ENCOUNTER — Encounter: Payer: Self-pay | Admitting: Internal Medicine

## 2017-01-25 ENCOUNTER — Other Ambulatory Visit (INDEPENDENT_AMBULATORY_CARE_PROVIDER_SITE_OTHER): Payer: Managed Care, Other (non HMO) | Admitting: Internal Medicine

## 2017-01-25 DIAGNOSIS — Z0000 Encounter for general adult medical examination without abnormal findings: Secondary | ICD-10-CM | POA: Diagnosis not present

## 2017-01-25 LAB — POCT URINALYSIS DIPSTICK
Appearance: NORMAL
Bilirubin, UA: NEGATIVE
Blood, UA: NEGATIVE
Glucose, UA: NEGATIVE
Ketones, UA: NEGATIVE
Leukocytes, UA: NEGATIVE
Nitrite, UA: NEGATIVE
Odor: NORMAL
Protein, UA: NEGATIVE
Spec Grav, UA: 1.01 (ref 1.010–1.025)
Urobilinogen, UA: 0.2 E.U./dL
pH, UA: 7 (ref 5.0–8.0)

## 2017-01-25 NOTE — Progress Notes (Signed)
U/A not done at time of CPE. U/A today is WNL.

## 2017-01-29 ENCOUNTER — Ambulatory Visit (INDEPENDENT_AMBULATORY_CARE_PROVIDER_SITE_OTHER): Payer: Managed Care, Other (non HMO) | Admitting: Internal Medicine

## 2017-01-29 DIAGNOSIS — H8110 Benign paroxysmal vertigo, unspecified ear: Secondary | ICD-10-CM

## 2017-01-29 MED ORDER — ALPRAZOLAM 0.25 MG PO TABS: 0.2500 mg | ORAL_TABLET | Freq: Two times a day (BID) | ORAL | 0 refills | Status: DC | PRN

## 2017-01-29 NOTE — Progress Notes (Signed)
   Subjective:    Patient ID: Carmela RimaJessica W Dohrman, female    DOB: 08/18/1975, 42 y.o.   MRN: [id number]  HPI She was seen in late December for complete physical examination.  Is felt intermittently dizzy since that time.  No fever or chills.  Right ear feels a bit full.    Review of Systems see above     Objective:   Physical Exam Skin warm and dry.  No  nystagmus noted.  TMs not red but may be slightly full.  Pharynx is clear.  Neck is supple.  Nodes none.  Extraocular movements are full.  PERRLA.       Assessment & Plan:  Benign positional vertigo    Plan: May take DayQuil for fullness in her ears.  Xanax 0.25 mg twice daily as needed for vertigo symptoms.

## 2017-02-01 ENCOUNTER — Encounter: Payer: Self-pay | Admitting: Internal Medicine

## 2017-02-01 NOTE — Patient Instructions (Signed)
Takes Xanax sparingly for vertigo.  May take DayQuil for ear congestion.  Call if not better in 7-10 days.

## 2017-07-20 ENCOUNTER — Encounter: Payer: Managed Care, Other (non HMO) | Admitting: Obstetrics & Gynecology

## 2017-12-02 ENCOUNTER — Other Ambulatory Visit: Payer: Self-pay | Admitting: Obstetrics and Gynecology

## 2017-12-02 DIAGNOSIS — N6310 Unspecified lump in the right breast, unspecified quadrant: Secondary | ICD-10-CM

## 2017-12-09 ENCOUNTER — Ambulatory Visit
Admission: RE | Admit: 2017-12-09 | Discharge: 2017-12-09 | Disposition: A | Discharge status: Home / Self Care | Payer: Managed Care, Other (non HMO) | Source: Ambulatory Visit | Attending: Obstetrics and Gynecology | Admitting: Obstetrics and Gynecology

## 2017-12-09 DIAGNOSIS — N6310 Unspecified lump in the right breast, unspecified quadrant: Secondary | ICD-10-CM

## 2018-02-18 ENCOUNTER — Other Ambulatory Visit: Payer: BLUE CROSS/BLUE SHIELD | Admitting: Internal Medicine

## 2018-02-18 DIAGNOSIS — E7800 Pure hypercholesterolemia, unspecified: Secondary | ICD-10-CM | POA: Diagnosis not present

## 2018-02-18 DIAGNOSIS — E559 Vitamin D deficiency, unspecified: Secondary | ICD-10-CM

## 2018-02-18 DIAGNOSIS — K589 Irritable bowel syndrome without diarrhea: Secondary | ICD-10-CM

## 2018-02-18 DIAGNOSIS — Z803 Family history of malignant neoplasm of breast: Secondary | ICD-10-CM

## 2018-02-18 DIAGNOSIS — Z0000 Encounter for general adult medical examination without abnormal findings: Secondary | ICD-10-CM

## 2018-02-19 LAB — LIPID PANEL
CHOL/HDL RATIO: 3.4 (calc) (ref <5.0)
Cholesterol: 186 mg/dL (ref <200)
HDL: 54 mg/dL (ref >50)
LDL Cholesterol (Calc): 111 mg/dL (calc) — ABNORMAL HIGH
NON-HDL CHOLESTEROL (CALC): 132 mg/dL — AB (ref <130)
Triglycerides: 106 mg/dL (ref <150)

## 2018-02-19 LAB — COMPLETE METABOLIC PANEL WITH GFR
AG RATIO: 1.7 (calc) (ref 1.0–2.5)
ALBUMIN MSPROF: 4.4 g/dL (ref 3.6–5.1)
ALT: 13 U/L (ref 6–29)
AST: 15 U/L (ref 10–30)
Alkaline phosphatase (APISO): 58 U/L (ref 33–115)
BUN: 15 mg/dL (ref 7–25)
CALCIUM: 9.4 mg/dL (ref 8.6–10.2)
CO2: 26 mmol/L (ref 20–32)
CREATININE: 0.84 mg/dL (ref 0.50–1.10)
Chloride: 103 mmol/L (ref 98–110)
GFR, EST AFRICAN AMERICAN: 101 mL/min/{1.73_m2} (ref >=60)
GFR, EST NON AFRICAN AMERICAN: 87 mL/min/{1.73_m2} (ref >=60)
GLOBULIN: 2.6 g/dL (ref 1.9–3.7)
Glucose, Bld: 92 mg/dL (ref 65–99)
POTASSIUM: 4.4 mmol/L (ref 3.5–5.3)
SODIUM: 138 mmol/L (ref 135–146)
Total Bilirubin: 0.4 mg/dL (ref 0.2–1.2)
Total Protein: 7 g/dL (ref 6.1–8.1)

## 2018-02-19 LAB — CBC WITH DIFFERENTIAL/PLATELET
ABSOLUTE MONOCYTES: 459 {cells}/uL (ref 200–950)
BASOS ABS: 62 {cells}/uL (ref 0–200)
Basophils Relative: 1 %
Eosinophils Absolute: 37 cells/uL (ref 15–500)
Eosinophils Relative: 0.6 %
HEMATOCRIT: 40.7 % (ref 35.0–45.0)
HEMOGLOBIN: 13.7 g/dL (ref 11.7–15.5)
LYMPHS ABS: 2096 {cells}/uL (ref 850–3900)
MCH: 29.5 pg (ref 27.0–33.0)
MCHC: 33.7 g/dL (ref 32.0–36.0)
MCV: 87.7 fL (ref 80.0–100.0)
MPV: 9.9 fL (ref 7.5–12.5)
Monocytes Relative: 7.4 %
NEUTROS ABS: 3546 {cells}/uL (ref 1500–7800)
NEUTROS PCT: 57.2 %
Platelets: 285 10*3/uL (ref 140–400)
RBC: 4.64 10*6/uL (ref 3.80–5.10)
RDW: 12.3 % (ref 11.0–15.0)
Total Lymphocyte: 33.8 %
WBC: 6.2 10*3/uL (ref 3.8–10.8)

## 2018-02-19 LAB — VITAMIN D 25 HYDROXY (VIT D DEFICIENCY, FRACTURES): Vit D, 25-Hydroxy: 27 ng/mL — ABNORMAL LOW (ref 30–100)

## 2018-02-19 LAB — TSH: TSH: 1.78 m[IU]/L

## 2018-02-22 ENCOUNTER — Encounter: Payer: Self-pay | Admitting: Internal Medicine

## 2018-02-22 ENCOUNTER — Ambulatory Visit (INDEPENDENT_AMBULATORY_CARE_PROVIDER_SITE_OTHER): Payer: BLUE CROSS/BLUE SHIELD | Admitting: Internal Medicine

## 2018-02-22 VITALS — BP 130/90 | HR 85 | Temp 98.5°F | Ht 63.0 in | Wt 180.3 lb

## 2018-02-22 DIAGNOSIS — Z803 Family history of malignant neoplasm of breast: Secondary | ICD-10-CM

## 2018-02-22 DIAGNOSIS — Z6831 Body mass index (BMI) 31.0-31.9, adult: Secondary | ICD-10-CM

## 2018-02-22 DIAGNOSIS — Z0000 Encounter for general adult medical examination without abnormal findings: Secondary | ICD-10-CM

## 2018-02-22 DIAGNOSIS — A6004 Herpesviral vulvovaginitis: Secondary | ICD-10-CM

## 2018-02-22 DIAGNOSIS — E559 Vitamin D deficiency, unspecified: Secondary | ICD-10-CM | POA: Diagnosis not present

## 2018-02-22 MED ORDER — VALACYCLOVIR HCL 500 MG PO TABS: ORAL_TABLET | ORAL | 3 refills | Status: DC

## 2018-02-22 NOTE — Progress Notes (Signed)
Subjective:    Patient ID: Brooke Bryan, female    DOB: 07/30/1975, 43 y.o.   MRN: [id number]  HPI 43 year old Female for health maintenance exam and evaluation of medical issues.  Had [facility] 2019.  There was a tiny right breast nodule.  Further imaging was advised.  [facility] demonstrating a benign cyst in the right breast at 3:00 measuring 4 mm.  There is family history of breast cancer in her mother who was diagnosed at age 41.  Maternal grandmother with history of breast cancer was later in life in her 180s or 9290s.  Had tetanus immunization at GYN in 2011.  History of chickenpox in 641986.  Recurrent urinary tract infections in childhood.  LEEP procedure in 2002 for [facility] Pap smear [facility].  History of HSV type II.  Currently has an outbreak.  She has been depression in this practice since she was 43 years old.  History of vertigo May 2016 that resolved with conservative therapy.  No known drug allergies.  No hospitalizations.  No serious illnesses.  Had strep pharyngitis March 2018 and July 2017.  Has a weight problem.  We talked at last visit in December 2018 about Dr. Francena HanlyBeasley's clinic.  She has yet to make an appointment.  Weight that time was 187 pounds and is now 180 pounds 5 ounces.  BMI is 31.94.  Social history: She is married.  She has 3 children 2 sons and 1 daughter.  One son has a translocation of chromosomes 1 and 7327.  She does not smoke.  Social alcohol consumption.  She works for [employer] company.  She [employer] and majored in Chief Financial Officermarketing.  Family history: Father with history of hyperlipidemia, hypertension and hip arthroplasty.  One brother in good health.  Mother with history of breast cancer as well as maternal grandmother.  Previous weight in 2016 was 169 pounds.  Review of Systems  Constitutional: Negative.   All other systems reviewed and are  negative. Recent outbreak of HSV type II that she would like for me to treat     Objective:   Physical Exam Vitals signs reviewed.  Constitutional:      General: She is not in acute distress.    Appearance: Normal appearance. She is obese.  HENT:     Head: Normocephalic and atraumatic.     Right Ear: Tympanic membrane normal.     Left Ear: Tympanic membrane normal.     Nose: Nose normal.     Mouth/Throat:     Mouth: Mucous membranes are moist.     Pharynx: Oropharynx is clear. No oropharyngeal exudate.  Eyes:     General: No scleral icterus.    Extraocular Movements: Extraocular movements intact.     Conjunctiva/sclera: Conjunctivae normal.     Pupils: Pupils are equal, round, and reactive to light.  Neck:     Musculoskeletal: Neck supple.     Comments: No thyromegaly Cardiovascular:     Rate and Rhythm: Normal rate and regular rhythm.     Pulses: Normal pulses.     Heart sounds: Normal heart sounds. No murmur.  Pulmonary:     Effort: Pulmonary effort is normal. No respiratory distress.     Breath sounds: Normal breath sounds. No wheezing.     Comments: Breasts without masses Abdominal:     General: Bowel sounds are normal.     Palpations: Abdomen is soft.  Tenderness: There is no abdominal tenderness. There is no rebound.     Hernia: No hernia is present.  Genitourinary:    Comments: Lesions consistent with HSV type II on vulva.  Pap deferred to GYN Musculoskeletal:        General: No deformity.  Lymphadenopathy:     Cervical: No cervical adenopathy.  Skin:    General: Skin is warm and dry.  Neurological:     General: No focal deficit present.     Mental Status: She is alert and oriented to person, place, and time.     Cranial Nerves: No cranial nerve deficit.     Motor: No weakness.  Psychiatric:        Mood and Affect: Mood normal.        Behavior: Behavior normal.        Thought Content: Thought content normal.        Judgment: Judgment normal.            Assessment & Plan:  Outbreak of HSV type II on vulva-treat with Valtrex  BMI 31.94  Mild elevation of LDL at 111  Vitamin D deficiency.  Level is 27.  Take 2000 units vitamin D3 regularly.  Family history of breast cancer in mother and maternal grandmother  Plan: Information given on Dr. Francena Hanly clinic.  Try to get exercise and lose weight.  Follow-up in 1 year or as needed.

## 2018-03-13 NOTE — Patient Instructions (Addendum)
Take Valtrex for HSV type II as directed.  Watch diet and exercise.  Consider Dr. Francena Hanly clinic.  Take 2000 units vitamin D3 daily.  LDL cholesterol is mildly elevated at 111.

## 2018-03-14 LAB — POCT URINALYSIS DIPSTICK
Appearance: NEGATIVE
Bilirubin, UA: NEGATIVE
Blood, UA: NEGATIVE
Glucose, UA: NEGATIVE
Ketones, UA: NEGATIVE
Leukocytes, UA: NEGATIVE
Nitrite, UA: NEGATIVE
Odor: NEGATIVE
Protein, UA: NEGATIVE
Spec Grav, UA: 1.01 (ref 1.010–1.025)
Urobilinogen, UA: 0.2 E.U./dL
pH, UA: 6 (ref 5.0–8.0)

## 2018-03-15 ENCOUNTER — Encounter (INDEPENDENT_AMBULATORY_CARE_PROVIDER_SITE_OTHER): Payer: BLUE CROSS/BLUE SHIELD

## 2018-03-28 ENCOUNTER — Ambulatory Visit (INDEPENDENT_AMBULATORY_CARE_PROVIDER_SITE_OTHER): Payer: BLUE CROSS/BLUE SHIELD | Admitting: Bariatrics

## 2018-03-28 ENCOUNTER — Encounter (INDEPENDENT_AMBULATORY_CARE_PROVIDER_SITE_OTHER): Payer: Self-pay | Admitting: Bariatrics

## 2018-03-28 VITALS — BP 115/78 | HR 70 | Temp 97.8°F | Ht 63.0 in | Wt 173.0 lb

## 2018-03-28 DIAGNOSIS — N62 Hypertrophy of breast: Secondary | ICD-10-CM | POA: Diagnosis not present

## 2018-03-28 DIAGNOSIS — R7303 Prediabetes: Secondary | ICD-10-CM | POA: Insufficient documentation

## 2018-03-28 DIAGNOSIS — Z1331 Encounter for screening for depression: Secondary | ICD-10-CM | POA: Diagnosis not present

## 2018-03-28 DIAGNOSIS — R0602 Shortness of breath: Secondary | ICD-10-CM

## 2018-03-28 DIAGNOSIS — E559 Vitamin D deficiency, unspecified: Secondary | ICD-10-CM | POA: Diagnosis not present

## 2018-03-28 DIAGNOSIS — Z9189 Other specified personal risk factors, not elsewhere classified: Secondary | ICD-10-CM

## 2018-03-28 DIAGNOSIS — R5383 Other fatigue: Secondary | ICD-10-CM

## 2018-03-28 DIAGNOSIS — Z6830 Body mass index (BMI) 30.0-30.9, adult: Secondary | ICD-10-CM

## 2018-03-28 DIAGNOSIS — E669 Obesity, unspecified: Secondary | ICD-10-CM

## 2018-03-28 DIAGNOSIS — Z0289 Encounter for other administrative examinations: Secondary | ICD-10-CM

## 2018-03-28 MED ORDER — VITAMIN D (ERGOCALCIFEROL) 1.25 MG (50000 UNIT) PO CAPS: 50000.0000 [IU] | ORAL_CAPSULE | ORAL | 0 refills | Status: DC

## 2018-03-28 NOTE — Progress Notes (Signed)
Office: [phone]  /  Fax: [phone]   Dear Dr. Lenord Bryan,   Thank you for referring Brooke Bryan to our clinic. The following note includes my evaluation and treatment recommendations.  HPI:   Chief Complaint: OBESITY    Brooke Bryan has been Brooke by Brooke Palau, MD for consultation regarding her obesity and obesity related comorbidities.    Brooke Bryan (MR# [id number]) is a 43 y.o. female who presents on 03/28/2018 for obesity evaluation and treatment. Current BMI is Body mass index is 30.65 kg/m.Marland Kitchen Brooke Bryan has been struggling with her weight for many years and has been unsuccessful in either losing weight, maintaining weight loss, or reaching her healthy weight goal.     Brooke Bryan attended our information session and states she is currently in the action stage of change and ready to dedicate time achieving and maintaining a healthier weight. Brooke Bryan is interested in becoming our Brooke Bryan and working on intensive lifestyle modifications including (but not limited to) diet, exercise and weight loss.    Brooke Bryan states her family eats meals together sometimes her desired weight loss is 38 lbs she started gaining weight at age 73 her heaviest weight ever was 173 lbs. she has significant food cravings issues  she has binge eating behaviors she struggles with emotional eating   Brooke Bryan states she likes to cook but eats outside the home 3-4 times per week. She reports she craves sweets after meals.   Fatigue Brooke Bryan feels her energy is lower than it should be. This has worsened with weight gain and has worsened recently. Brooke Bryan admits to daytime somnolence and denies waking up still tired. Brooke Bryan is at risk for obstructive sleep apnea. Patent has a history of symptoms of daytime fatigue. Brooke Bryan generally gets 8 hours of sleep per night, and states they generally have restful sleep. Snoring is not present. Apneic episodes are not present. Epworth Sleepiness Score is  7.  Dyspnea on exertion Brooke Bryan notes increasing shortness of breath with exercising and seems to be worsening over time with weight gain. She notes getting out of breath sooner with activity than she used to. This has gotten worse recently. Brooke Bryan states she has fatigue and shortness of breath with certain activities.  Vitamin D deficiency Brooke Bryan has a diagnosis of Vitamin D deficiency. Her last Vitamin D level was reported to be 27 on 02/18/2018. She is currently taking OTC Vit D.  At risk for osteopenia and osteoporosis Brooke Bryan is at higher risk of osteopenia and osteoporosis due to Vitamin D deficiency.   Pre-Diabetes Brooke Bryan has a diagnosis of prediabetes based on her elevated Hgb A1c and was informed this puts her at greater risk of developing diabetes. She is taking OTC Berberine and continues to work on diet and exercise to decrease risk of diabetes. She denies nausea or hypoglycemia.  Depression Screen Brooke Bryan's Food and Mood (modified PHQ-9) score was 10.  Depression screen PHQ 2/9 03/28/2018  Decreased Interest 1  Down, Depressed, Hopeless 1  PHQ - 2 Score 2  Altered sleeping 1  Tired, decreased energy 2  Change in appetite 2  Feeling bad or failure about yourself  1  Trouble concentrating 1  Moving slowly or fidgety/restless 1  Suicidal thoughts 0  PHQ-9 Score 10  Difficult doing work/chores Not difficult at all   ASSESSMENT AND PLAN:  Other fatigue - Plan: EKG 12-Lead, CANCELED: EKG 12-Lead  Shortness of breath on exertion  Vitamin D deficiency - Plan: Vitamin D, Ergocalciferol, (DRISDOL) 1.25 MG (50000 UT)  CAPS capsule  Prediabetes - Plan: Hemoglobin A1c, Insulin, random  Depression screening  At risk for osteoporosis  Class 1 obesity with serious comorbidity and body mass index (BMI) of 30.0 to 30.9 in adult, unspecified obesity type  PLAN:  Fatigue Brooke Bryan was informed that her fatigue may be related to obesity, depression or many other causes. Labs  will be ordered, and in the meanwhile Brooke Bryan has agreed to work on diet, exercise and weight loss to help with fatigue. Proper sleep hygiene was discussed including the need for 7-8 hours of quality sleep each night. A sleep study was not ordered based on symptoms and Epworth score.  Dyspnea on exertion Brooke Bryan's shortness of breath appears to be obesity related and exercise induced. She has agreed to work on weight loss and gradually increase exercise to treat her exercise induced shortness of breath. If Brooke Bryan follows our instructions and loses weight without improvement of her shortness of breath, we will plan to refer to pulmonology. We will monitor this condition regularly. Brooke Bryan was advised to gradually increase her exercise and she agrees with this plan.  Vitamin D Deficiency Brooke Bryan was informed that low Vitamin D levels contributes to fatigue and are associated with obesity, breast, and colon cancer. She was given a RX for Vit D @ 50,000 IU every week #4 with 0 refills and will follow-up for routine testing of Vitamin D, at least 2-3 times per year. She was informed of the risk of over-replacement of Vitamin D and agrees to not increase her dose unless she discusses this with [place] first. Brooke Bryan agrees to follow-up with our clinic in 2 weeks.  At risk for osteopenia and osteoporosis Brooke Bryan was given extended  (15 minutes) osteoporosis prevention counseling today. Brooke Bryan is at risk for osteopenia and osteoporsis due to her Vitamin D deficiency. She was encouraged to take her Vitamin D and follow her higher calcium diet and increase strengthening exercise to help strengthen her bones and decrease her risk of osteopenia and osteoporosis.  Pre-Diabetes Brooke Bryan will continue to work on weight loss, exercise, and decreasing simple carbohydrates in her diet to help decrease the risk of diabetes. We dicussed metformin including benefits and risks. She was informed that eating too many simple  carbohydrates or too many calories at one sitting increases the likelihood of GI side effects. Brooke Bryan is currently taking OTC Berberine and denies polyphagia. Janayla will have Hgb A1c and insulin checked today and agrees to follow-up with [place] as directed to monitor her progress.  Depression Screen Kahlia had a moderately positive depression screening. Depression is commonly associated with obesity and often results in emotional eating behaviors. We will monitor this closely and work on CBT to help improve the non-hunger eating patterns. Referral to Psychology may be required if no improvement is seen as she continues in our clinic.  Obesity Saphira is currently in the action stage of change and her goal is to continue with weight loss efforts. I recommend Lakicia begin the structured treatment plan as follows:  She has agreed to follow the category 2 plan. She has been instructed on meal planning and intentional eating.  Ayonna has been instructed to eventually work up to a goal of 150 minutes of combined cardio and strengthening exercise per week for weight loss and overall health benefits. We discussed the following Behavioral Modification Strategies today: increasing lean protein intake, decreasing simple carbohydrates, increasing vegetables, increase H20 intake, decrease eating out, no skipping meals, work on meal planning and easy cooking plans,  keeping healthy foods in the home, and planning for success.   She was informed of the importance of frequent follow up visits to maximize her success with intensive lifestyle modifications for her multiple health conditions. She was informed we would discuss her lab results at her next visit unless there is a critical issue that needs to be addressed sooner. Aamia agreed to keep her next visit at the agreed upon time to discuss these results.  ALLERGIES: No Known Allergies  MEDICATIONS: Current Outpatient Medications on File Prior to Visit    Medication Sig Dispense Refill  . Barberry-Oreg Grape-Goldenseal (BERBERINE COMPLEX PO) Take 450 mg by mouth.    . cholecalciferol (VITAMIN D3) 25 MCG (1000 UT) tablet Take 1,000 Units by mouth daily.    . Multiple Vitamins-Minerals (MULTIVITAL-M PO) Take by mouth.    . valACYclovir (VALTREX) 500 MG tablet One by mouth twice daily x 5 days 10 tablet 3  . ALPRAZolam (XANAX) 0.25 MG tablet Take 1 tablet (0.25 mg total) by mouth 2 (two) times daily as needed. DIZZINESS (Brooke Bryan not taking: Reported on 03/28/2018) 30 tablet 0   No current facility-administered medications on file prior to visit.     PAST MEDICAL HISTORY: Past Medical History:  Diagnosis Date  . Anxiety   . Fatigue   . Herpes simplex virus type 1 (HSV-1) dermatitis   . History of abnormal Pap smear   . Pre-diabetes   . Vitamin D deficiency     PAST SURGICAL HISTORY: Past Surgical History:  Procedure Laterality Date  . LEEP  2002   Dr. Jenetta Downer    SOCIAL HISTORY: Social History   Tobacco Use  . Smoking status: Never Smoker  . Smokeless tobacco: Never Used  Substance Use Topics  . Alcohol use: Not on file    Comment: rarely  . Drug use: No    FAMILY HISTORY: Family History  Problem Relation Age of Onset  . Cancer Maternal Grandmother   . Thyroid disease Mother   . Cancer Mother   . Alcoholism Mother   . Hypertension Father   . Hyperlipidemia Father   . AAA (abdominal aortic aneurysm) Father    ROS: Review of Systems  Constitutional: Positive for malaise/fatigue.  HENT: Positive for ear discharge.        Positive for decreased hearing.  Genitourinary:       Positive for polyuria.  Skin: Positive for rash (under her chin).  Neurological: Positive for dizziness.  Psychiatric/Behavioral:       Positive for stress.   PHYSICAL EXAM: Blood pressure 115/78, pulse 70, temperature 97.8 F (36.6 C), temperature source Oral, height 5\' 3"  (1.6 m), weight 173 lb (78.5 kg), last menstrual period 03/19/2018,  SpO2 98 %. Body mass index is 30.65 kg/m. Physical Exam Vitals signs reviewed.  Constitutional:      Appearance: Normal appearance. She is well-developed. She is obese.  HENT:     Head: Normocephalic and atraumatic.     Comments: Mallampati = 3.    Nose: Nose normal.  Eyes:     General: No scleral icterus. Neck:     Musculoskeletal: Normal range of motion.  Cardiovascular:     Rate and Rhythm: Normal rate and regular rhythm.  Pulmonary:     Effort: Pulmonary effort is normal. No respiratory distress.  Abdominal:     Palpations: Abdomen is soft.     Tenderness: There is no abdominal tenderness.  Musculoskeletal: Normal range of motion.     Comments: Range  of motion normal in all four extremities.  Skin:    General: Skin is warm and dry.  Neurological:     Mental Status: She is alert and oriented to person, place, and time.     Coordination: Coordination normal.  Psychiatric:        Mood and Affect: Mood and affect normal.        Behavior: Behavior normal.   RECENT LABS AND TESTS: BMET    Component Value Date/Time   NA 138 02/18/2018 0930   K 4.4 02/18/2018 0930   CL 103 02/18/2018 0930   CO2 26 02/18/2018 0930   GLUCOSE 92 02/18/2018 0930   BUN 15 02/18/2018 0930   CREATININE 0.84 02/18/2018 0930   CALCIUM 9.4 02/18/2018 0930   GFRNONAA 87 02/18/2018 0930   GFRAA 101 02/18/2018 0930   Lab Results  Component Value Date   HGBA1C 5.4 01/15/2017   No results found for: INSULIN CBC    Component Value Date/Time   WBC 6.2 02/18/2018 0930   RBC 4.64 02/18/2018 0930   HGB 13.7 02/18/2018 0930   HCT 40.7 02/18/2018 0930   PLT 285 02/18/2018 0930   MCV 87.7 02/18/2018 0930   MCH 29.5 02/18/2018 0930   MCHC 33.7 02/18/2018 0930   RDW 12.3 02/18/2018 0930   LYMPHSABS 2,096 02/18/2018 0930   MONOABS 0.4 07/12/2014 0941   EOSABS 37 02/18/2018 0930   BASOSABS 62 02/18/2018 0930   Iron/TIBC/Ferritin/ %Sat No results found for: IRON, TIBC, FERRITIN,  IRONPCTSAT Lipid Panel     Component Value Date/Time   CHOL 186 02/18/2018 0930   TRIG 106 02/18/2018 0930   HDL 54 02/18/2018 0930   CHOLHDL 3.4 02/18/2018 0930   VLDL 38 07/12/2014 0941   LDLCALC 111 (H) 02/18/2018 0930   Hepatic Function Panel     Component Value Date/Time   PROT 7.0 02/18/2018 0930   ALBUMIN 4.5 07/12/2014 0941   AST 15 02/18/2018 0930   ALT 13 02/18/2018 0930   ALKPHOS 56 07/12/2014 0941   BILITOT 0.4 02/18/2018 0930      Component Value Date/Time   TSH 1.78 02/18/2018 0930   TSH 1.40 01/14/2017 1159   TSH 1.634 07/12/2014 0941    Ref. Range 02/18/2018 09:30  Vitamin D, 25-Hydroxy Latest Ref Range: 30 - 100 ng/mL 27 (L)   ECG  shows NSR with a rate of 67 BPM.   INDIRECT CALORIMETER done today shows a VO2 of 211 and a REE of 1465.  Her calculated basal metabolic rate is [id number] thus her basal metabolic rate is worse than expected.  OBESITY BEHAVIORAL INTERVENTION VISIT  Today's visit was #1  Starting weight: 173 lbs Starting date: 03/28/2018 Today's weight: 173 lbs  Today's date: 03/28/2018 Total lbs lost to date: 0    03/28/2018  Height 5\' 3"  (1.6 m)  Weight 173 lb (78.5 kg)  BMI (Calculated) 30.65  BLOOD PRESSURE - SYSTOLIC 115  BLOOD PRESSURE - DIASTOLIC 78  Waist Measurement  38 inches   Body Fat % 37.1 %  Total Body Water (lbs) 75.2 lbs  RMR 1465   ASK: We discussed the diagnosis of obesity with Carmela RimaJessica W Masden today and Shanda BumpsJessica agreed to give us permission to discuss obesity behavioral modification therapy today.  ASSESS: Shanda BumpsJessica has the diagnosis of obesity and her BMI today is 30.65. Shanda BumpsJessica is in the action stage of change.  ADVISE: Shanda BumpsJessica was educated on the multiple health risks of obesity as well as the benefit of  weight loss to improve her health. She was advised of the need for long term treatment and the importance of lifestyle modifications to improve her current health and to decrease her risk of future health  problems.  AGREE: Multiple dietary modification options and treatment options were discussed and  Corleen agreed to follow the recommendations documented in the above note.  ARRANGE: Valissa was educated on the importance of frequent visits to treat obesity as outlined per CMS and USPSTF guidelines and agreed to schedule her next follow up appointment today.  Fernanda Drum, am acting as Energy manager for [employer], DO  I have reviewed the above documentation for accuracy and completeness, and I agree with the above. -Corinna Capra, DO

## 2018-03-29 ENCOUNTER — Telehealth: Payer: Self-pay | Admitting: Internal Medicine

## 2018-03-29 LAB — HEMOGLOBIN A1C
ESTIMATED AVERAGE GLUCOSE: 105 mg/dL
Hgb A1c MFr Bld: 5.3 % (ref 4.8–5.6)

## 2018-03-29 LAB — INSULIN, RANDOM: INSULIN: 7.9 u[IU]/mL (ref 2.6–24.9)

## 2018-03-29 NOTE — Telephone Encounter (Signed)
Faxed Medical Records to [facility]

## 2018-04-11 ENCOUNTER — Encounter (INDEPENDENT_AMBULATORY_CARE_PROVIDER_SITE_OTHER): Payer: Self-pay

## 2018-04-11 ENCOUNTER — Ambulatory Visit (INDEPENDENT_AMBULATORY_CARE_PROVIDER_SITE_OTHER): Payer: BLUE CROSS/BLUE SHIELD | Admitting: Bariatrics

## 2018-04-12 ENCOUNTER — Other Ambulatory Visit (INDEPENDENT_AMBULATORY_CARE_PROVIDER_SITE_OTHER): Payer: Self-pay | Admitting: Bariatrics

## 2018-04-12 ENCOUNTER — Encounter (INDEPENDENT_AMBULATORY_CARE_PROVIDER_SITE_OTHER): Payer: Self-pay

## 2018-04-12 DIAGNOSIS — E559 Vitamin D deficiency, unspecified: Secondary | ICD-10-CM

## 2018-04-15 ENCOUNTER — Other Ambulatory Visit (INDEPENDENT_AMBULATORY_CARE_PROVIDER_SITE_OTHER): Payer: Self-pay | Admitting: Bariatrics

## 2018-04-15 DIAGNOSIS — E559 Vitamin D deficiency, unspecified: Secondary | ICD-10-CM

## 2018-05-12 NOTE — Telephone Encounter (Signed)
Re-faxed medical records to [facility]

## 2018-07-28 ENCOUNTER — Other Ambulatory Visit: Payer: Self-pay

## 2018-07-28 ENCOUNTER — Ambulatory Visit (INDEPENDENT_AMBULATORY_CARE_PROVIDER_SITE_OTHER): Payer: BC Managed Care – PPO | Admitting: Internal Medicine

## 2018-07-28 ENCOUNTER — Telehealth: Payer: Self-pay

## 2018-07-28 DIAGNOSIS — Z20828 Contact with and (suspected) exposure to other viral communicable diseases: Secondary | ICD-10-CM

## 2018-07-28 DIAGNOSIS — Z20822 Contact with and (suspected) exposure to covid-19: Secondary | ICD-10-CM

## 2018-07-28 MED ORDER — ALPRAZOLAM 0.25 MG PO TABS: 0.2500 mg | ORAL_TABLET | Freq: Two times a day (BID) | ORAL | 0 refills | Status: DC | PRN

## 2018-07-28 NOTE — Telephone Encounter (Signed)
Patient called her son was exposed to covid-19 at work he got tested yesterday still waiting on results. Patient's employer is asking her to get tested patient would like to speak with you about this. Scheduled virtual visit at 4:30 only appointment left.

## 2018-07-28 NOTE — Telephone Encounter (Signed)
Set up virtual visit 

## 2018-07-29 DIAGNOSIS — Z20828 Contact with and (suspected) exposure to other viral communicable diseases: Secondary | ICD-10-CM | POA: Diagnosis not present

## 2018-08-01 ENCOUNTER — Encounter: Payer: Self-pay | Admitting: Internal Medicine

## 2018-08-01 NOTE — Patient Instructions (Signed)
To have Covid 19 testing outside my office tomorrow. Xanax refilled for anxiety.

## 2018-08-01 NOTE — Progress Notes (Signed)
   Subjective:    Patient ID: Brooke Bryan, female    DOB: Mar 17, 1975, 43 y.o.   MRN: [id number]  HPI 43 year old  Female seen today by interactive audio and video telecommunications due to the coronavirus pandemic.  She is identified using 2 identifiers as Brooke Bryan, a longstanding patient in this practice. She is agreeable to visit in this format today.   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  She has been told by her employer that she needs to be tested for coronavirus before she can return to the office because her son works as a Astronomer at [employer].  Apparently several employees tested positive there for COVID-19 and the restaurant has closed for cleaning.  Currently her son is asymptomatic but has been tested by the restaurant and is awaiting results.  Patient currently has no symptoms of fever chills myalgias nausea vomiting diarrhea or headache.  She does have anxiety and is requesting something for anxiety.    Review of Systems see above     Objective:   Physical Exam Patient seen virtually and appears to be somewhat anxious and relating the story above.  She appears to be asymptomatic based on history obtained.       Assessment & Plan:  Possible COVID-19 exposure  Anxiety state- refill Xanax  Plan: Patient will present tomorrow outside my office for Covid 19 testing in her car.  Addendum: July 10,2020. I presented outside my office in PPE to perform nasal swab sampling on pt in her car. Sample sent for Covid 19 testing.  25 minutes spent on Doxy visit and car testing including history taking medical decision making and Covid 19 testing outside my office.

## 2018-08-04 NOTE — Progress Notes (Signed)
Checking for results  

## 2018-08-18 LAB — SARS-COV-2 RNA,(COVID-19) QUALITATIVE NAAT: SARS CoV2 RNA: NOT DETECTED

## 2018-10-07 ENCOUNTER — Encounter: Payer: Self-pay | Admitting: Internal Medicine

## 2018-10-07 ENCOUNTER — Other Ambulatory Visit: Payer: Self-pay

## 2018-10-07 ENCOUNTER — Telehealth: Payer: Self-pay

## 2018-10-07 ENCOUNTER — Ambulatory Visit (INDEPENDENT_AMBULATORY_CARE_PROVIDER_SITE_OTHER): Payer: BC Managed Care – PPO | Admitting: Internal Medicine

## 2018-10-07 VITALS — BP 130/90 | HR 76 | Temp 98.0°F | Ht 63.0 in | Wt 172.0 lb

## 2018-10-07 DIAGNOSIS — R1011 Right upper quadrant pain: Secondary | ICD-10-CM

## 2018-10-07 DIAGNOSIS — R1010 Upper abdominal pain, unspecified: Secondary | ICD-10-CM

## 2018-10-07 LAB — POCT URINALYSIS DIPSTICK
Appearance: NEGATIVE
Bilirubin, UA: NEGATIVE
Blood, UA: NEGATIVE
Glucose, UA: NEGATIVE
Ketones, UA: NEGATIVE
Leukocytes, UA: NEGATIVE
Nitrite, UA: NEGATIVE
Odor: NEGATIVE
Protein, UA: NEGATIVE
Spec Grav, UA: 1.01 (ref 1.010–1.025)
Urobilinogen, UA: 0.2 E.U./dL
pH, UA: 6.5 (ref 5.0–8.0)

## 2018-10-07 NOTE — Patient Instructions (Signed)
Take Pepcid twice daily. Obtain over the counter. Watch fried and fatty foods. This may be musculoskeletal pain from moving. Labs drawn and pending. Pt going to beach for a week. To have upper abd ultrasound on return.

## 2018-10-07 NOTE — Progress Notes (Signed)
   Subjective:    Patient ID: Brooke Bryan, female    DOB: 19-Jan-1976, 43 y.o.   MRN: [id number]  HPI Patient in today with right upper quadrant abdominal pain intermittently for a week.  Has had some GE reflux symptoms.  Leaving to go to the [employer] for a week. No fever chills nausea vomiting.  Is concerned about getting ill on her trip.  No urinary symptoms.  Denies any musculoskeletal strain in the abdominal area.  No dysuria.   Review of Systems see above     Objective:   Physical Exam She is anxious.  Blood pressure 130/90 pulse 76 regular temperature 98 degrees orally pulse oximetry 99% weight 172 pounds BMI 30.47.  Abdomen is soft nondistended without hepatosplenomegaly or masses.  She has some mild tenderness in her right upper quadrant without rebound tenderness.       Assessment & Plan:  Right upper quadrant abdominal pain and GE reflux symptoms-headed on vacation for 1 week to the [employer].  Plan: Schedule ultrasound of the right upper quadrant upon return.  CBC with differential C met lipase and amylase are all improved today within normal limits.  May take over-the-counter Pepcid twice daily for reflux symptoms.  Avoid fried and fatty foods during vacation.

## 2018-10-07 NOTE — Telephone Encounter (Signed)
Work her in

## 2018-10-07 NOTE — Telephone Encounter (Signed)
Patient called wants to be seen today she is going out of town tomorrow to [place] for a week. She has been having a "dull" abdominal pain for a few days now no vomiting, no fever no other symptoms she had some diarrhea a few days ago but she said her stomach is very sensitive.

## 2018-10-08 LAB — COMPLETE METABOLIC PANEL WITH GFR
AG Ratio: 1.7 (calc) (ref 1.0–2.5)
ALT: 11 U/L (ref 6–29)
AST: 14 U/L (ref 10–30)
Albumin: 4.4 g/dL (ref 3.6–5.1)
Alkaline phosphatase (APISO): 55 U/L (ref 31–125)
BUN: 11 mg/dL (ref 7–25)
CO2: 23 mmol/L (ref 20–32)
Calcium: 9.4 mg/dL (ref 8.6–10.2)
Chloride: 105 mmol/L (ref 98–110)
Creat: 0.76 mg/dL (ref 0.50–1.10)
GFR, Est African American: 114 mL/min/{1.73_m2} (ref >=60)
GFR, Est Non African American: 98 mL/min/{1.73_m2} (ref >=60)
Globulin: 2.6 g/dL (calc) (ref 1.9–3.7)
Glucose, Bld: 87 mg/dL (ref 65–99)
Potassium: 4.4 mmol/L (ref 3.5–5.3)
Sodium: 138 mmol/L (ref 135–146)
Total Bilirubin: 0.4 mg/dL (ref 0.2–1.2)
Total Protein: 7 g/dL (ref 6.1–8.1)

## 2018-10-08 LAB — CBC WITH DIFFERENTIAL/PLATELET
Absolute Monocytes: 569 cells/uL (ref 200–950)
Basophils Absolute: 47 cells/uL (ref 0–200)
Basophils Relative: 0.6 %
Eosinophils Absolute: 63 cells/uL (ref 15–500)
Eosinophils Relative: 0.8 %
HCT: 42.5 % (ref 35.0–45.0)
Hemoglobin: 13.8 g/dL (ref 11.7–15.5)
Lymphs Abs: 2615 cells/uL (ref 850–3900)
MCH: 28.8 pg (ref 27.0–33.0)
MCHC: 32.5 g/dL (ref 32.0–36.0)
MCV: 88.5 fL (ref 80.0–100.0)
MPV: 10 fL (ref 7.5–12.5)
Monocytes Relative: 7.2 %
Neutro Abs: 4606 cells/uL (ref 1500–7800)
Neutrophils Relative %: 58.3 %
Platelets: 270 10*3/uL (ref 140–400)
RBC: 4.8 10*6/uL (ref 3.80–5.10)
RDW: 12.4 % (ref 11.0–15.0)
Total Lymphocyte: 33.1 %
WBC: 7.9 10*3/uL (ref 3.8–10.8)

## 2018-10-08 LAB — LIPASE: Lipase: 39 U/L (ref 7–60)

## 2018-10-08 LAB — AMYLASE: Amylase: 50 U/L (ref 21–101)

## 2018-10-18 ENCOUNTER — Ambulatory Visit
Admission: RE | Admit: 2018-10-18 | Discharge: 2018-10-18 | Disposition: A | Discharge status: Home / Self Care | Payer: BLUE CROSS/BLUE SHIELD | Source: Ambulatory Visit | Attending: Internal Medicine | Admitting: Internal Medicine

## 2018-10-18 DIAGNOSIS — R1011 Right upper quadrant pain: Secondary | ICD-10-CM

## 2018-10-18 NOTE — Telephone Encounter (Signed)
See Thursday

## 2018-10-18 NOTE — Telephone Encounter (Signed)
Pt returned call and I let her know that her Ultra sound was negative but she said she is still having pain in her abdomen

## 2018-10-18 NOTE — Telephone Encounter (Signed)
Appt scheduled

## 2018-10-20 ENCOUNTER — Ambulatory Visit: Payer: BC Managed Care – PPO | Admitting: Internal Medicine

## 2018-10-28 ENCOUNTER — Ambulatory Visit (INDEPENDENT_AMBULATORY_CARE_PROVIDER_SITE_OTHER): Payer: BC Managed Care – PPO | Admitting: Internal Medicine

## 2018-10-28 ENCOUNTER — Encounter: Payer: Self-pay | Admitting: Internal Medicine

## 2018-10-28 ENCOUNTER — Other Ambulatory Visit: Payer: Self-pay

## 2018-10-28 VITALS — BP 110/80 | HR 78 | Temp 98.0°F | Ht 63.0 in | Wt 176.0 lb

## 2018-10-28 DIAGNOSIS — R1011 Right upper quadrant pain: Secondary | ICD-10-CM

## 2018-10-28 DIAGNOSIS — R109 Unspecified abdominal pain: Secondary | ICD-10-CM | POA: Diagnosis not present

## 2018-10-28 NOTE — Progress Notes (Signed)
   Subjective:    Patient ID: Brooke Bryan, female    DOB: 02/19/1975, 43 y.o.   MRN: [id number]  HPI 43 year old Female with recurrent  RUQ pain recently returned from vacation and did OK. Ate a lot of foods on vacation including cornbread and pinto beans. Recent labs were normal. Amylase and lipase were normal.   H. Pylori breath test pending today.  Has been taking Pepcid prn which helps "indigestion" which she describes as burning in esophagus.  This week has not had pain at all. Had it just before vacation but not since. Really would like answer to what is causing pain.  BMs are normal.  Review of Systems not exercising abdominal muscles. No vomiting. No nausea.     Objective:   Physical Exam  VS reviewed. Abdomen obese soft nondisteneded. BS normal. No heptosplenomegaly or masses. Slight tenderness RUQ without rebound tenderness      Assessment & Plan:  Persistent right upper quadrant abdominal pain with negative ultrasound.  Could be musculoskeletal pain but would like to rule out cholecystitis.  She is going to have hepatobiliary scan next and if that is negative we will likely refer her to GI for evaluation.  The pain is vague and intermittent and has not responded to Pepcid.

## 2018-10-31 LAB — H. PYLORI BREATH TEST: H. pylori Breath Test: NOT DETECTED

## 2018-10-31 NOTE — Patient Instructions (Signed)
To have hepatobiliary scan in the near future.

## 2018-11-01 ENCOUNTER — Other Ambulatory Visit: Payer: Self-pay | Admitting: Internal Medicine

## 2018-11-07 ENCOUNTER — Encounter: Payer: Self-pay | Admitting: Internal Medicine

## 2018-11-07 ENCOUNTER — Ambulatory Visit (INDEPENDENT_AMBULATORY_CARE_PROVIDER_SITE_OTHER): Payer: BC Managed Care – PPO | Admitting: Internal Medicine

## 2018-11-07 ENCOUNTER — Other Ambulatory Visit: Payer: Self-pay

## 2018-11-07 ENCOUNTER — Telehealth: Payer: Self-pay | Admitting: Internal Medicine

## 2018-11-07 VITALS — Temp 97.8°F | Ht 63.0 in | Wt 176.0 lb

## 2018-11-07 DIAGNOSIS — R059 Cough, unspecified: Secondary | ICD-10-CM

## 2018-11-07 DIAGNOSIS — J029 Acute pharyngitis, unspecified: Secondary | ICD-10-CM

## 2018-11-07 DIAGNOSIS — R05 Cough: Secondary | ICD-10-CM | POA: Diagnosis not present

## 2018-11-07 DIAGNOSIS — Z20822 Contact with and (suspected) exposure to covid-19: Secondary | ICD-10-CM

## 2018-11-07 DIAGNOSIS — Z20828 Contact with and (suspected) exposure to other viral communicable diseases: Secondary | ICD-10-CM | POA: Diagnosis not present

## 2018-11-07 DIAGNOSIS — J069 Acute upper respiratory infection, unspecified: Secondary | ICD-10-CM

## 2018-11-07 MED ORDER — AMOXICILLIN 500 MG PO CAPS: 500.0000 mg | ORAL_CAPSULE | Freq: Three times a day (TID) | ORAL | 0 refills | Status: DC

## 2018-11-07 NOTE — Telephone Encounter (Signed)
Brooke Bryan [id number]  [name] called to say last weekend her throat was scratchy, then on Tuesday she got her Flu Shot, however now she has sore throat, congestion, coughing, runny nose, ear pressure, face pressure, no fever, no chills, no exposure.

## 2018-11-07 NOTE — Telephone Encounter (Signed)
Scheduled appointment

## 2018-11-07 NOTE — Progress Notes (Signed)
   Subjective:    Patient ID: Brooke Bryan, female    DOB: August 22, 1975, 43 y.o.   MRN: [id number]  HPI 43 year old Female seen today via interactive audio and video telecommunications due to the coronavirus pandemic.  Apparently had some scratchy throat this past week can.  Received her flu vaccine and subsequently came down with sore throat, coughing, runny nose ear pressure.  No known COVID-19 exposure.  She is identified using 2 identifiers as Brooke Bryan, a longstanding patient in this practice.  She is agreeable to visit in this format today.  Recently has been seen regarding upper abdominal pain and is scheduled for hepatobiliary scan November 12    Review of Systems no documented fever, chills, nausea or vomiting.  No dysgeusia.     Objective:   Physical Exam Reports she is afebrile.  Is seen virtually in no acute distress.       Assessment & Plan:  Acute upper respiratory infection  Plan: Had COVID-19 testing.  Prescribed amoxicillin 500 mg 3 times a day for 10 days.  Rest and drink plenty of fluids.  Addendum: November 09, 2018-COVID-19 test is negative

## 2018-11-07 NOTE — Telephone Encounter (Signed)
Doxy call 

## 2018-11-09 LAB — NOVEL CORONAVIRUS, NAA: SARS-CoV-2, NAA: NOT DETECTED

## 2018-11-10 ENCOUNTER — Encounter (HOSPITAL_COMMUNITY): Payer: BC Managed Care – PPO

## 2018-11-14 NOTE — Patient Instructions (Signed)
Amoxicillin 500 mg 3 times a day for 10 days.  Rest and drink plenty of fluids.  COVID-19 test to be done at test center.

## 2018-11-16 ENCOUNTER — Telehealth: Payer: Self-pay | Admitting: Internal Medicine

## 2018-11-16 NOTE — Telephone Encounter (Signed)
LVM to CB and schedule CPE and Labs due after 02/23/2019

## 2018-11-16 NOTE — Telephone Encounter (Signed)
Patient called back and scheduled appointment

## 2018-12-01 ENCOUNTER — Encounter (HOSPITAL_COMMUNITY): Payer: BC Managed Care – PPO

## 2018-12-05 DIAGNOSIS — Z01419 Encounter for gynecological examination (general) (routine) without abnormal findings: Secondary | ICD-10-CM | POA: Diagnosis not present

## 2018-12-05 DIAGNOSIS — Z1231 Encounter for screening mammogram for malignant neoplasm of breast: Secondary | ICD-10-CM | POA: Diagnosis not present

## 2018-12-05 DIAGNOSIS — Z6832 Body mass index (BMI) 32.0-32.9, adult: Secondary | ICD-10-CM | POA: Diagnosis not present

## 2018-12-07 DIAGNOSIS — N62 Hypertrophy of breast: Secondary | ICD-10-CM | POA: Diagnosis not present

## 2018-12-22 DIAGNOSIS — N62 Hypertrophy of breast: Secondary | ICD-10-CM | POA: Diagnosis not present

## 2018-12-22 DIAGNOSIS — N6031 Fibrosclerosis of right breast: Secondary | ICD-10-CM | POA: Diagnosis not present

## 2018-12-22 DIAGNOSIS — N6032 Fibrosclerosis of left breast: Secondary | ICD-10-CM | POA: Diagnosis not present

## 2019-01-30 DIAGNOSIS — F4323 Adjustment disorder with mixed anxiety and depressed mood: Secondary | ICD-10-CM | POA: Diagnosis not present

## 2019-02-24 ENCOUNTER — Other Ambulatory Visit: Payer: Self-pay

## 2019-02-24 ENCOUNTER — Other Ambulatory Visit: Payer: BC Managed Care – PPO | Admitting: Internal Medicine

## 2019-02-24 DIAGNOSIS — Z1329 Encounter for screening for other suspected endocrine disorder: Secondary | ICD-10-CM | POA: Diagnosis not present

## 2019-02-24 DIAGNOSIS — Z0000 Encounter for general adult medical examination without abnormal findings: Secondary | ICD-10-CM | POA: Diagnosis not present

## 2019-02-24 DIAGNOSIS — E559 Vitamin D deficiency, unspecified: Secondary | ICD-10-CM | POA: Diagnosis not present

## 2019-02-24 DIAGNOSIS — E7800 Pure hypercholesterolemia, unspecified: Secondary | ICD-10-CM | POA: Diagnosis not present

## 2019-02-25 LAB — COMPLETE METABOLIC PANEL WITH GFR
AG Ratio: 1.4 (calc) (ref 1.0–2.5)
ALT: 19 U/L (ref 6–29)
AST: 20 U/L (ref 10–30)
Albumin: 4.2 g/dL (ref 3.6–5.1)
Alkaline phosphatase (APISO): 55 U/L (ref 31–125)
BUN: 12 mg/dL (ref 7–25)
CO2: 26 mmol/L (ref 20–32)
Calcium: 9.5 mg/dL (ref 8.6–10.2)
Chloride: 102 mmol/L (ref 98–110)
Creat: 0.8 mg/dL (ref 0.50–1.10)
GFR, Est African American: 106 mL/min/{1.73_m2} (ref >=60)
GFR, Est Non African American: 92 mL/min/{1.73_m2} (ref >=60)
Globulin: 2.9 g/dL (calc) (ref 1.9–3.7)
Glucose, Bld: 88 mg/dL (ref 65–99)
Potassium: 4.4 mmol/L (ref 3.5–5.3)
Sodium: 137 mmol/L (ref 135–146)
Total Bilirubin: 0.5 mg/dL (ref 0.2–1.2)
Total Protein: 7.1 g/dL (ref 6.1–8.1)

## 2019-02-25 LAB — CBC WITH DIFFERENTIAL/PLATELET
Absolute Monocytes: 554 cells/uL (ref 200–950)
Basophils Absolute: 57 cells/uL (ref 0–200)
Basophils Relative: 0.8 %
Eosinophils Absolute: 71 cells/uL (ref 15–500)
Eosinophils Relative: 1 %
HCT: 43.2 % (ref 35.0–45.0)
Hemoglobin: 14.3 g/dL (ref 11.7–15.5)
Lymphs Abs: 2371 cells/uL (ref 850–3900)
MCH: 29 pg (ref 27.0–33.0)
MCHC: 33.1 g/dL (ref 32.0–36.0)
MCV: 87.6 fL (ref 80.0–100.0)
MPV: 10.1 fL (ref 7.5–12.5)
Monocytes Relative: 7.8 %
Neutro Abs: 4047 cells/uL (ref 1500–7800)
Neutrophils Relative %: 57 %
Platelets: 348 10*3/uL (ref 140–400)
RBC: 4.93 10*6/uL (ref 3.80–5.10)
RDW: 12.7 % (ref 11.0–15.0)
Total Lymphocyte: 33.4 %
WBC: 7.1 10*3/uL (ref 3.8–10.8)

## 2019-02-25 LAB — LIPID PANEL
Cholesterol: 243 mg/dL — ABNORMAL HIGH (ref <200)
HDL: 58 mg/dL (ref >=50)
LDL Cholesterol (Calc): 152 mg/dL (calc) — ABNORMAL HIGH
Non-HDL Cholesterol (Calc): 185 mg/dL (calc) — ABNORMAL HIGH (ref <130)
Total CHOL/HDL Ratio: 4.2 (calc) (ref <5.0)
Triglycerides: 194 mg/dL — ABNORMAL HIGH (ref <150)

## 2019-02-25 LAB — HEMOGLOBIN A1C
Hgb A1c MFr Bld: 5.2 % of total Hgb (ref <5.7)
Mean Plasma Glucose: 103 (calc)
eAG (mmol/L): 5.7 (calc)

## 2019-02-25 LAB — TSH: TSH: 1.71 mIU/L

## 2019-02-25 LAB — VITAMIN D 25 HYDROXY (VIT D DEFICIENCY, FRACTURES): Vit D, 25-Hydroxy: 43 ng/mL (ref 30–100)

## 2019-02-28 ENCOUNTER — Other Ambulatory Visit: Payer: Self-pay

## 2019-02-28 ENCOUNTER — Ambulatory Visit (INDEPENDENT_AMBULATORY_CARE_PROVIDER_SITE_OTHER): Payer: BC Managed Care – PPO | Admitting: Internal Medicine

## 2019-02-28 ENCOUNTER — Encounter: Payer: Self-pay | Admitting: Internal Medicine

## 2019-02-28 VITALS — BP 120/70 | HR 90 | Temp 98.0°F | Ht 63.0 in | Wt 185.0 lb

## 2019-02-28 DIAGNOSIS — Z6832 Body mass index (BMI) 32.0-32.9, adult: Secondary | ICD-10-CM | POA: Diagnosis not present

## 2019-02-28 DIAGNOSIS — Z9889 Other specified postprocedural states: Secondary | ICD-10-CM

## 2019-02-28 DIAGNOSIS — E782 Mixed hyperlipidemia: Secondary | ICD-10-CM

## 2019-02-28 DIAGNOSIS — Z0000 Encounter for general adult medical examination without abnormal findings: Secondary | ICD-10-CM

## 2019-02-28 DIAGNOSIS — Z23 Encounter for immunization: Secondary | ICD-10-CM

## 2019-02-28 LAB — POCT URINALYSIS DIPSTICK
Appearance: NEGATIVE
Bilirubin, UA: NEGATIVE
Blood, UA: NEGATIVE
Glucose, UA: NEGATIVE
Ketones, UA: NEGATIVE
Leukocytes, UA: NEGATIVE
Nitrite, UA: NEGATIVE
Odor: NEGATIVE
Protein, UA: NEGATIVE
Spec Grav, UA: 1.01 (ref 1.010–1.025)
Urobilinogen, UA: 0.2 E.U./dL
pH, UA: 6.5 (ref 5.0–8.0)

## 2019-03-03 DIAGNOSIS — Z03818 Encounter for observation for suspected exposure to other biological agents ruled out: Secondary | ICD-10-CM | POA: Diagnosis not present

## 2019-03-03 DIAGNOSIS — Z20828 Contact with and (suspected) exposure to other viral communicable diseases: Secondary | ICD-10-CM | POA: Diagnosis not present

## 2019-03-19 NOTE — Patient Instructions (Signed)
Please work on diet exercise and weight loss.  Follow-up here in 6 months.  Tetanus immunization update given.  We will check lipid panel in 6 months.

## 2019-03-19 NOTE — Progress Notes (Signed)
   Subjective:    Patient ID: Brooke Bryan, female    DOB: 18-Sep-1975, 44 y.o.   MRN: [id number]  HPI 44 year old female in today for health maintenance exam and evaluation of medical issues.  She has been a patient in this practice since she was 44 years old.  No known drug allergies.  Had breast reduction surgery in 2020 and is pleased with result.  Is followed at [facility] for GYN care.  Has had mammogram through that office in November 2020.  History of chickenpox 57.  Had recurrent urinary tract infections in childhood.  LEEP procedure in 2002 for abnormal Pap smear at [facility].  History of herpes simplex type II.  History of vertigo in May 2016 that resolved with conservative therapy.  No known drug allergies.  No serious illnesses.  Had strep pharyngitis March 2018 in July 2017.  She has history of weight problem.  She has tried various diets.  Previous weight in 2016 was 169 pounds.  In October 2020 she weighed 176 pounds and now weighs 185 pounds.  Social history: She is married.  She has 3 children, 2 sons and a daughter.  1 son is translocation of chromosomes 1 and 78.  She does not smoke.  Social alcohol consumption.  She works for [employer].  She attended [employer] [place] [place] and majored in Pharmacologist.  1 son has had addiction issues.  Family history: Father with history of hyperlipidemia hypertension and hip arthroplasty.  1 brother in good health.  Mother with history of breast cancer as well as maternal grandmother.    Review of Systems  Respiratory: Negative.   Cardiovascular: Negative.   Gastrointestinal: Negative.   Genitourinary: Negative.   Neurological: Negative.   Psychiatric/Behavioral:       Situational stress with son       Objective:   Physical Exam Blood pressure 120/70 BMI 32.77 pulse 90 temperature 98 degrees pulse oximetry 97%  Skin warm and dry.  Nodes none.  TMs and pharynx are clear neck is supple without JVD thyromegaly  or carotid bruits.  Chest clear to auscultation.  Cardiac exam regular rate and rhythm.  Status post breast reduction surgery.  Abdomen soft nondistended without hepatosplenomegaly masses or tenderness.  Pelvic exam deferred to GYN physician.  No lower extremity pitting edema.  Neuro no focal deficits.       Assessment & Plan:  4 years ago she had some impaired glucose tolerance with hemoglobin A1c 5.7%.  Now hemoglobin A1c is 5.2%.  Vitamin D is is normal.  TSH is normal.  Urine dipstick is normal.  CBC and C met are normal.  However she has mixed hyperlipidemia with total cholesterol 243, HDL 58, triglycerides 194 and LDL 152.  She does not want to be on statin therapy.  This runs in her father side of the family.  She will work on diet exercise and weight loss and follow-up in 6 months with lipid panel and office visit.  Tetanus immunization update given.

## 2019-05-26 DIAGNOSIS — F4323 Adjustment disorder with mixed anxiety and depressed mood: Secondary | ICD-10-CM | POA: Diagnosis not present

## 2019-06-01 DIAGNOSIS — F4323 Adjustment disorder with mixed anxiety and depressed mood: Secondary | ICD-10-CM | POA: Diagnosis not present

## 2019-06-06 IMAGING — MG DIGITAL DIAGNOSTIC UNILATERAL RIGHT MAMMOGRAM WITH TOMO AND CAD
4 series · 4 of 12 positions shown · non-contrast
Comparison: Screening mammogram dated 11/30/2017.

CLINICAL DATA: Patient returns today to evaluate a possible RIGHT
breast mass identified on recent screening mammogram.

EXAM:
DIGITAL DIAGNOSTIC RIGHT MAMMOGRAM WITH CAD AND TOMO
ULTRASOUND RIGHT BREAST

[R CC synth-2D]
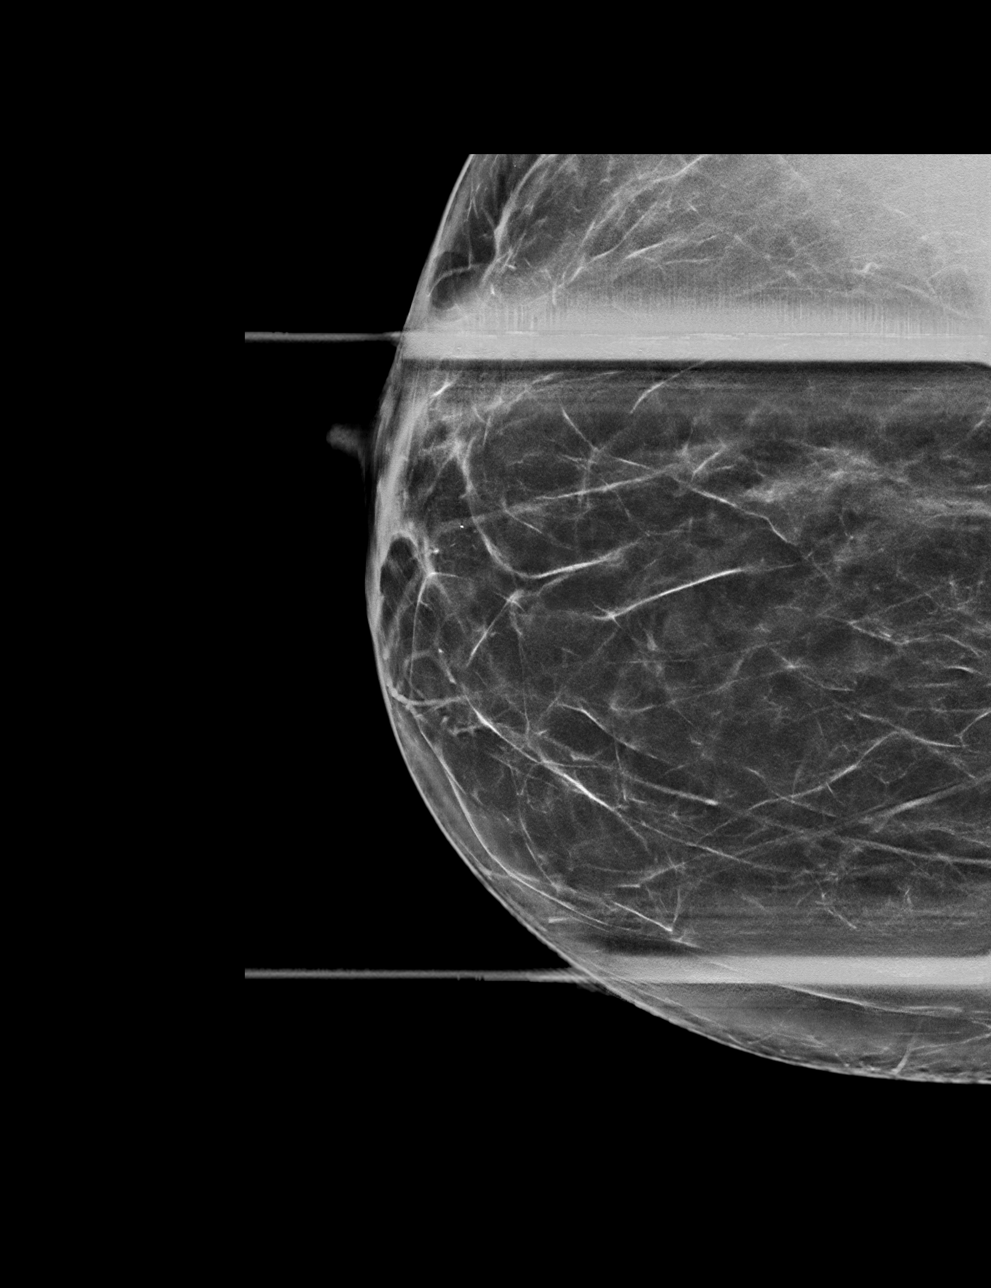

[R MLO synth-2D]
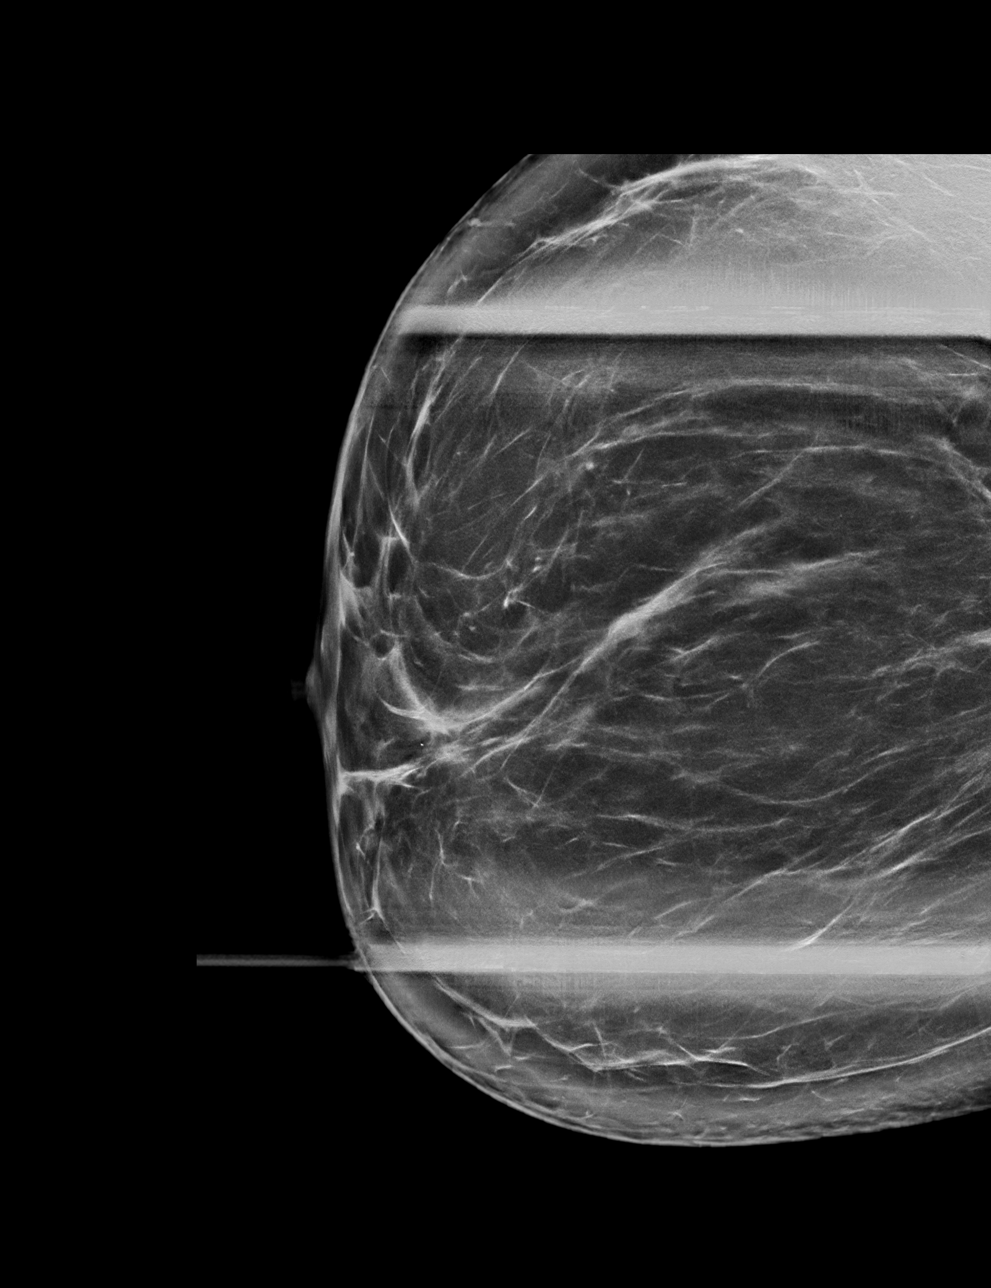

[R MLO tomo · tomo slice 47/94.0]
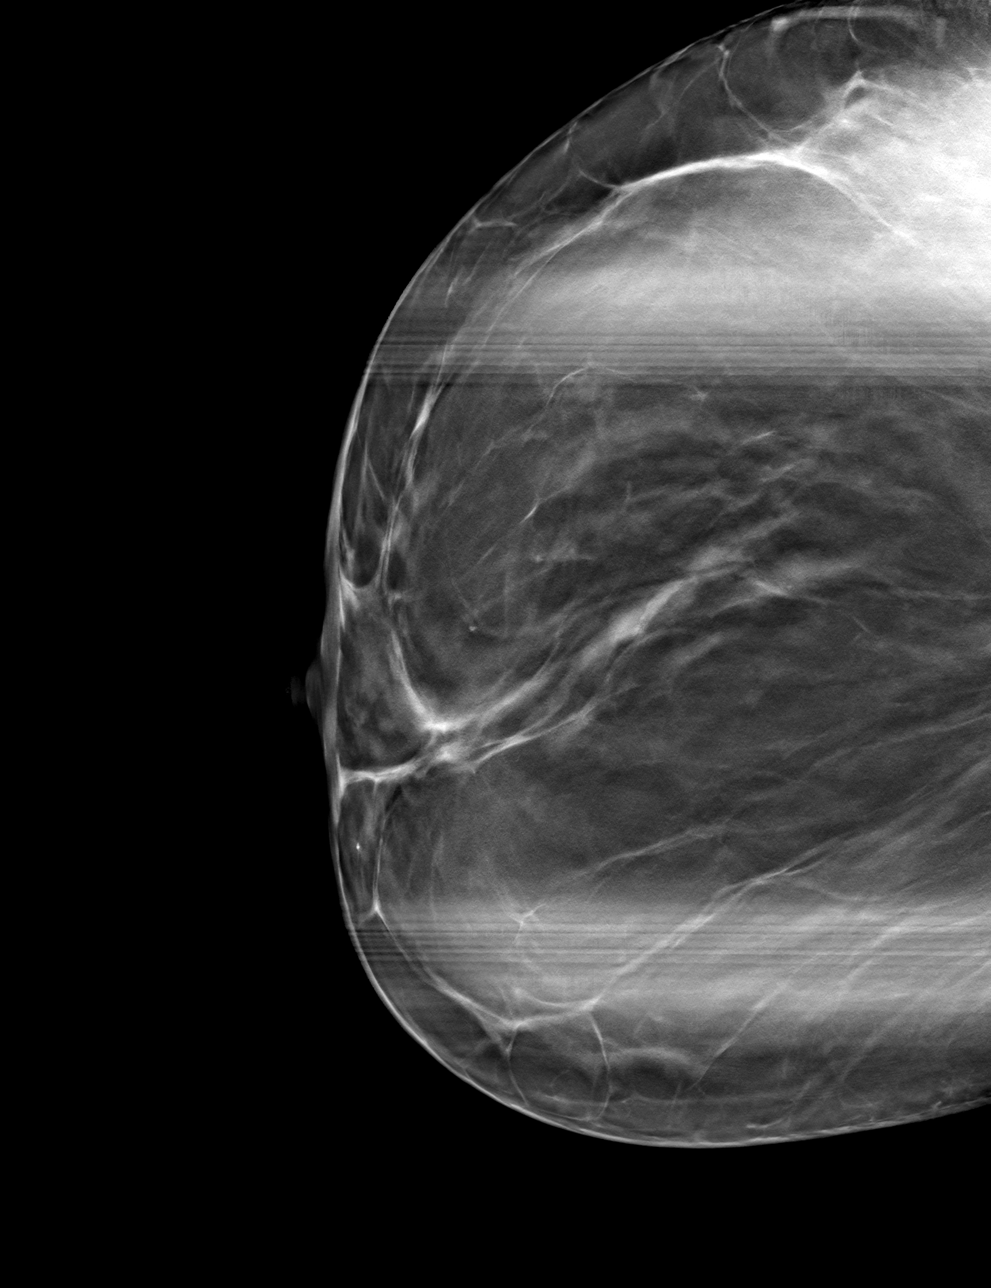

[R CC tomo · tomo slice 41/80.0]
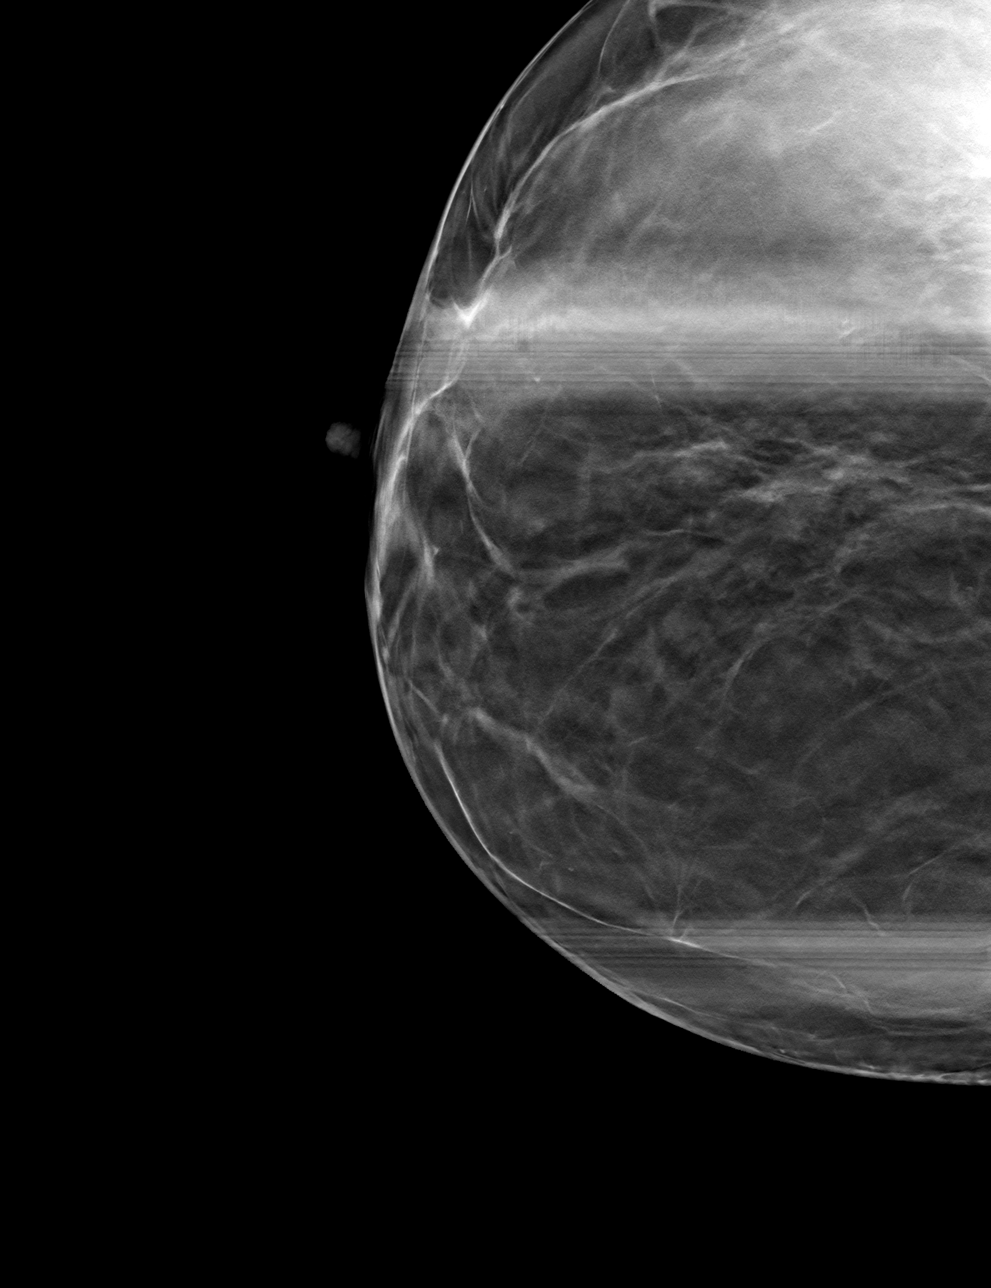

[4 of 12 positions shown; findings below may reference images not displayed]

ACR Breast Density Category b: There are scattered areas of
fibroglandular density.
FINDINGS: The low-density circumscribed mass identified on recent screening
mammogram is less conspicuous on today's study, measuring
approximately 5 mm greatest dimension, obscured by surrounding
fibroglandular tissues.

Mammographic images were processed with CAD.

Targeted ultrasound is performed, showing a benign simple cyst in
the RIGHT breast at the 3 o'clock axis, 3 cm from the nipple,
measuring 4 mm, corresponding to the mammographic finding. No
suspicious solid or cystic mass is identified within the RIGHT
breast.
IMPRESSION: No evidence of malignancy. Benign cyst in the RIGHT breast at the 3
o'clock axis, measuring 4 mm, corresponding to the mammographic
finding.

Patient may return to routine annual bilateral screening mammogram
schedule.

RECOMMENDATION:
Screening mammogram in one year.(Code:S8-7-PKS)

I have discussed the findings and recommendations with the patient.
Results were also provided in writing at the conclusion of the
visit. If applicable, a reminder letter will be sent to the patient
regarding the next appointment.

BI-RADS CATEGORY  2: Benign.

## 2019-06-08 DIAGNOSIS — F4323 Adjustment disorder with mixed anxiety and depressed mood: Secondary | ICD-10-CM | POA: Diagnosis not present

## 2019-06-12 DIAGNOSIS — L905 Scar conditions and fibrosis of skin: Secondary | ICD-10-CM | POA: Diagnosis not present

## 2019-06-12 DIAGNOSIS — N62 Hypertrophy of breast: Secondary | ICD-10-CM | POA: Diagnosis not present

## 2019-09-01 ENCOUNTER — Telehealth: Payer: Self-pay | Admitting: Internal Medicine

## 2019-09-01 NOTE — Telephone Encounter (Signed)
Added

## 2019-09-01 NOTE — Telephone Encounter (Signed)
She is asking for a CRP and Vitamin D level to be added to her labs for next week

## 2019-09-01 NOTE — Telephone Encounter (Signed)
Brooke Bryan [phone]  Brooke Bryan called to say she is coming in on Monday for her labs and she would like to get C-1 reactive protein and have her Vit D checked also. I let her know she may need to discuss this at office visit before labs could be drawn.

## 2019-09-04 ENCOUNTER — Other Ambulatory Visit: Payer: BC Managed Care – PPO | Admitting: Internal Medicine

## 2019-09-04 ENCOUNTER — Other Ambulatory Visit: Payer: Self-pay

## 2019-09-04 DIAGNOSIS — E782 Mixed hyperlipidemia: Secondary | ICD-10-CM

## 2019-09-05 LAB — LIPID PANEL
Cholesterol: 183 mg/dL (ref <200)
HDL: 54 mg/dL (ref >=50)
LDL Cholesterol (Calc): 104 mg/dL (calc) — ABNORMAL HIGH
Non-HDL Cholesterol (Calc): 129 mg/dL (calc) (ref <130)
Total CHOL/HDL Ratio: 3.4 (calc) (ref <5.0)
Triglycerides: 158 mg/dL — ABNORMAL HIGH (ref <150)

## 2019-09-05 LAB — VITAMIN D 25 HYDROXY (VIT D DEFICIENCY, FRACTURES): Vit D, 25-Hydroxy: 45 ng/mL (ref 30–100)

## 2019-09-05 LAB — C-REACTIVE PROTEIN: CRP: 1.3 mg/L (ref <8.0)

## 2019-09-08 ENCOUNTER — Encounter: Payer: Self-pay | Admitting: Internal Medicine

## 2019-09-08 ENCOUNTER — Other Ambulatory Visit: Payer: Self-pay

## 2019-09-08 ENCOUNTER — Ambulatory Visit (INDEPENDENT_AMBULATORY_CARE_PROVIDER_SITE_OTHER): Payer: BC Managed Care – PPO | Admitting: Internal Medicine

## 2019-09-08 VITALS — BP 102/60 | HR 73 | Ht 63.0 in | Wt 165.0 lb

## 2019-09-08 DIAGNOSIS — Z803 Family history of malignant neoplasm of breast: Secondary | ICD-10-CM

## 2019-09-08 DIAGNOSIS — Z9889 Other specified postprocedural states: Secondary | ICD-10-CM

## 2019-09-08 NOTE — Patient Instructions (Signed)
It was a pleasure to see you today.  I am pleased with your weight loss.  Continue diet and exercise regimen.  Physical exam due February 2022.

## 2019-09-08 NOTE — Progress Notes (Signed)
   Subjective:    Patient ID: Brooke Bryan, female    DOB: 1975-09-23, 44 y.o.   MRN: [id number]  HPI  44 year old Female for follow up on hyperlipidemia. Has lost 20 pounds with diet and exercise. Working  out virtually with Psychologist, educational. C reactive protein is normal. This was done at her request. She had breast reduction surgery by Dr. Odis Luster in early December 2020.  She is pleased that she had the surgery.  Vitamin D level is normal at 45.  Lipid panel is near normal.  Triglycerides are 158 and LDL cholesterol 104 with total cholesterol of 183 and HDL of 54.  In February 2021 total cholesterol was 243, HDL 58, triglycerides 194 and LDL cholesterol 152.  She has made all of these changes just with diet and exercise and is not on lipid-lowering medication.  She was congratulated on her lifestyle changes.  She seems happy.  She has 3 children and they all seem to be doing fairly well.  Work has been a bit stressful but she is doing okay with that.  Had mammogram November 2020 at [facility].  She has been a patient in this practice since she was 44 years old.  History of vertigo in May 2016 that resolved with conservative therapy.  No known drug allergies.  No hospitalizations.  No serious illnesses.  2 episodes of strep pharyngitis March 2018 in July 2017.  Family history: Father with history of hyperlipidemia, hypertension and hip arthroplasty.  1 brother in good health.  Mother with history of breast cancer as well as maternal grandmother.  In 2016 she weighed 169 pounds.   Review of Systems she is happy with her diet and exercise routine.     Objective:   Physical Exam  Weight is 165 pounds. Blood pressure 102/60 pulse 73 pulse oximetry 97% BMI 29.23  Seen today looking tremor in no acute distress     Assessment & Plan:  Status post bilateral breast reduction surgery by Dr. Odis Luster in December 2020  20 pound weight loss with diet and exercise status post bilateral breast  reduction  Family history of hyperlipidemia in father  Plan: Physical exam due in February 2022.  She will continue with diet and exercise efforts.  She looks great and feels well.  I am very pleased with the changes she has made lifestyle diet and exercise.

## 2019-12-08 ENCOUNTER — Encounter: Payer: Self-pay | Admitting: Internal Medicine

## 2019-12-08 DIAGNOSIS — Z1231 Encounter for screening mammogram for malignant neoplasm of breast: Secondary | ICD-10-CM | POA: Diagnosis not present

## 2020-01-03 DIAGNOSIS — N62 Hypertrophy of breast: Secondary | ICD-10-CM | POA: Diagnosis not present

## 2020-02-26 DIAGNOSIS — Z03818 Encounter for observation for suspected exposure to other biological agents ruled out: Secondary | ICD-10-CM | POA: Diagnosis not present

## 2020-02-26 DIAGNOSIS — Z20822 Contact with and (suspected) exposure to covid-19: Secondary | ICD-10-CM | POA: Diagnosis not present

## 2020-04-14 IMAGING — US US ABDOMEN LIMITED
1 series · 14 of 25 positions shown · non-contrast
Comparison: None.

CLINICAL DATA: Right upper quadrant pain

EXAM:
ULTRASOUND ABDOMEN LIMITED RIGHT UPPER QUADRANT

[Series 1: us abdomen limited · 0.15mm/px · 14 of 42 slices shown]
[im 1/42]
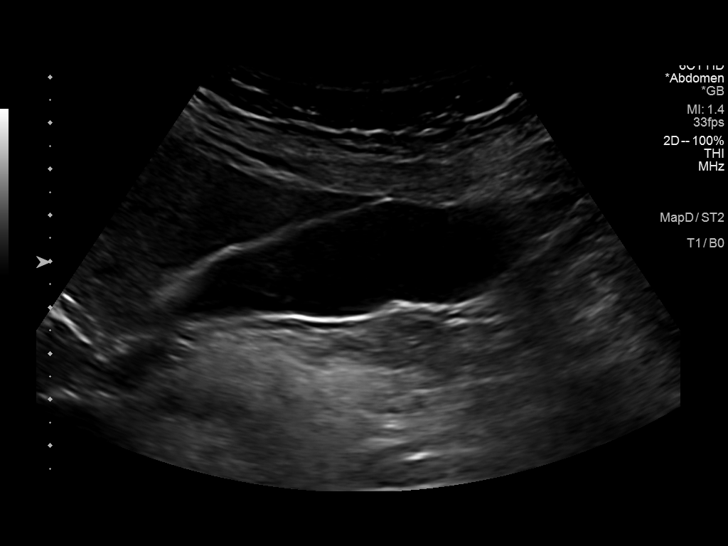
[im 4/42]
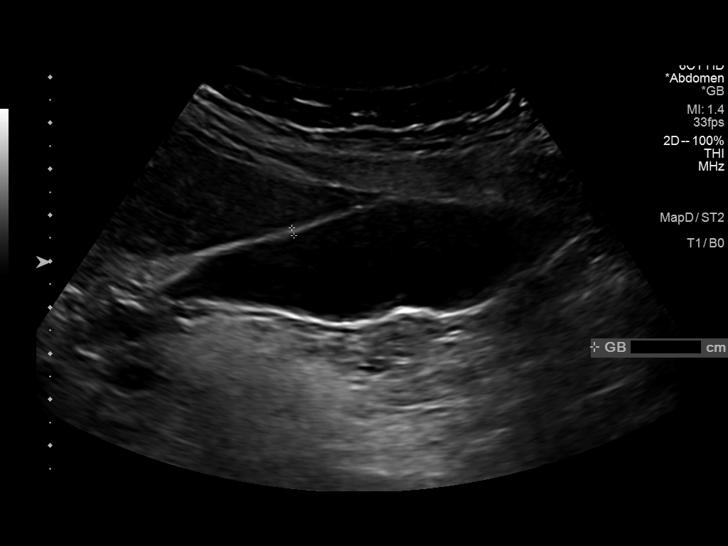
[im 7/42]
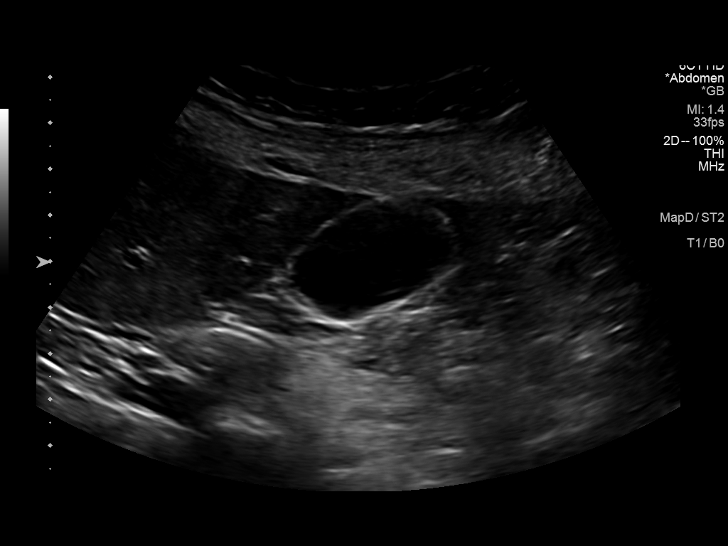
[im 11/42]
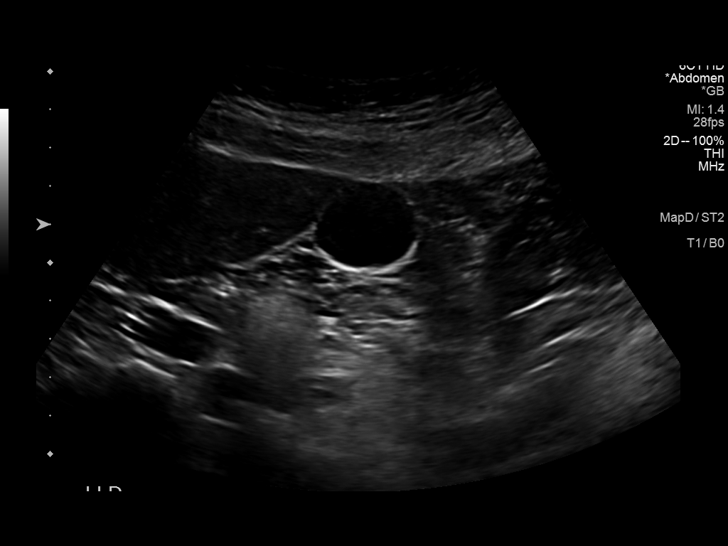
[im 14/42]
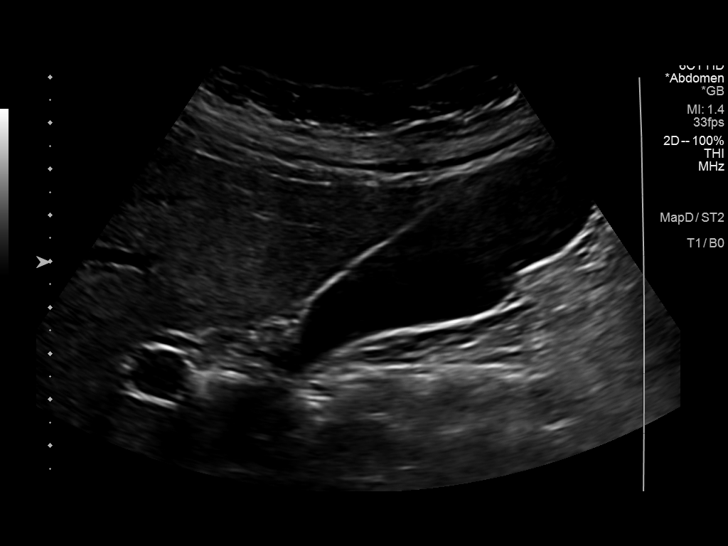
[im 16/42]
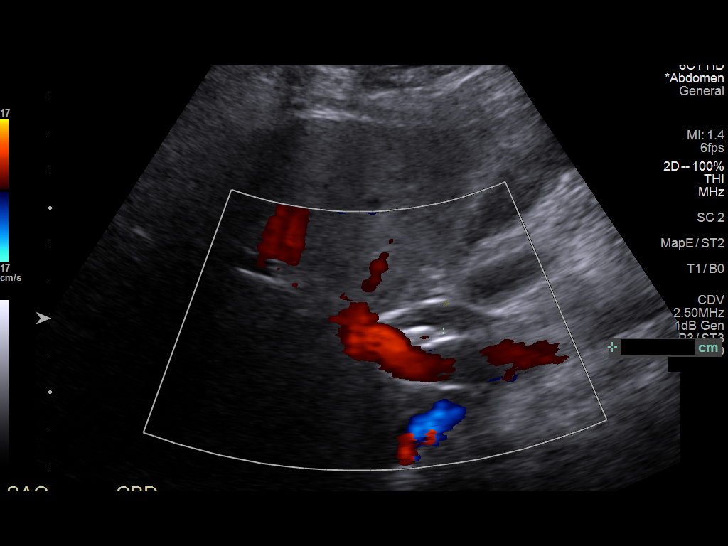
[im 19/42]
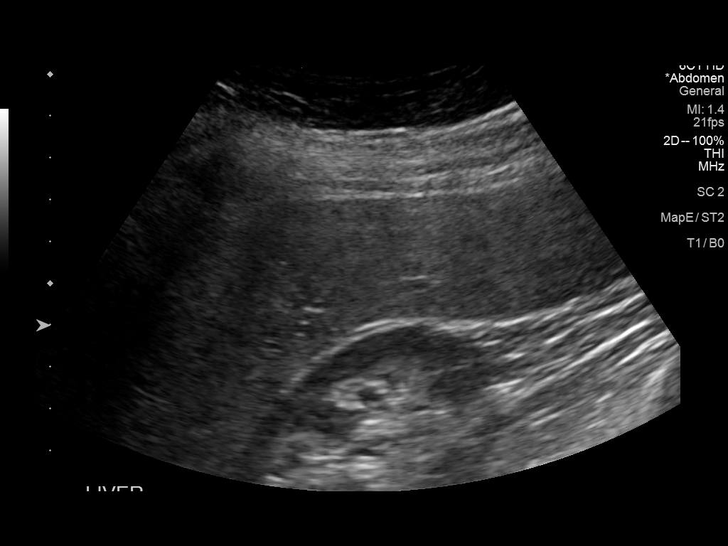
[im 23/42]
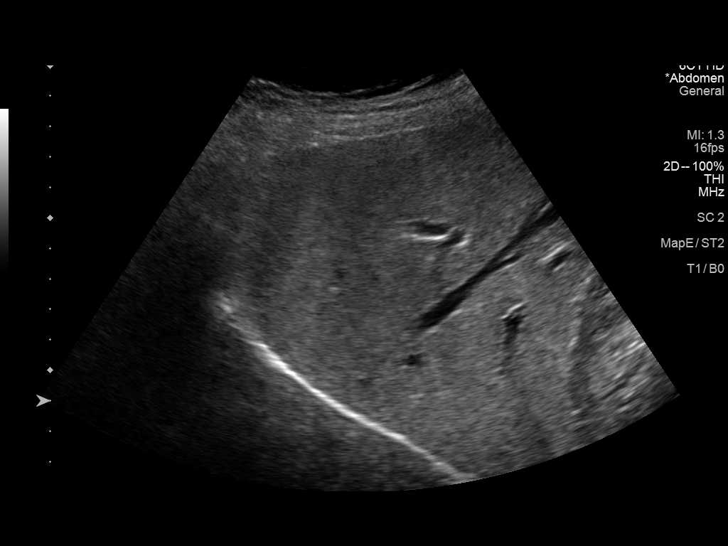
[im 26/42]
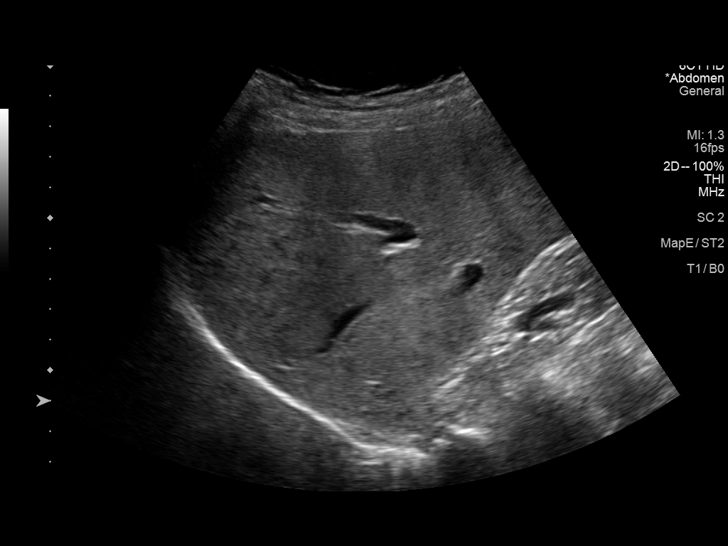
[im 28/42]
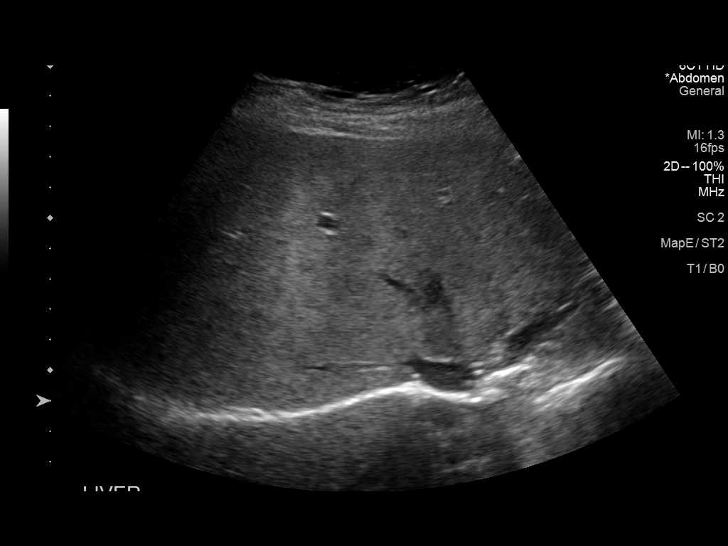
[im 31/42]
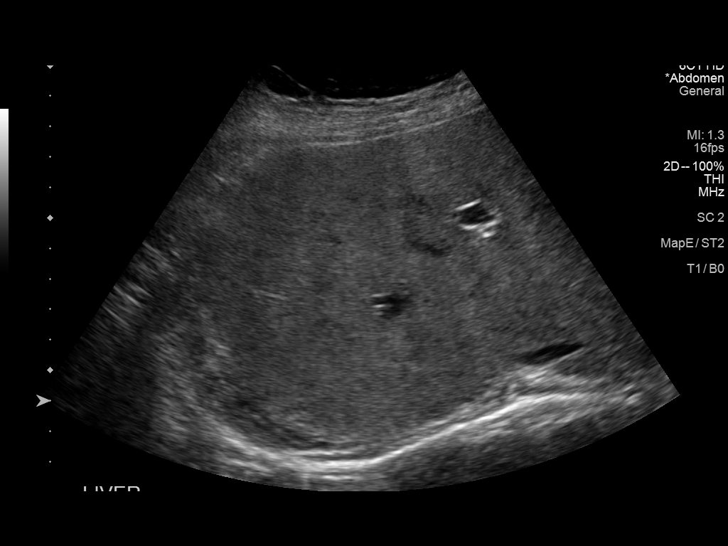
[im 35/42]
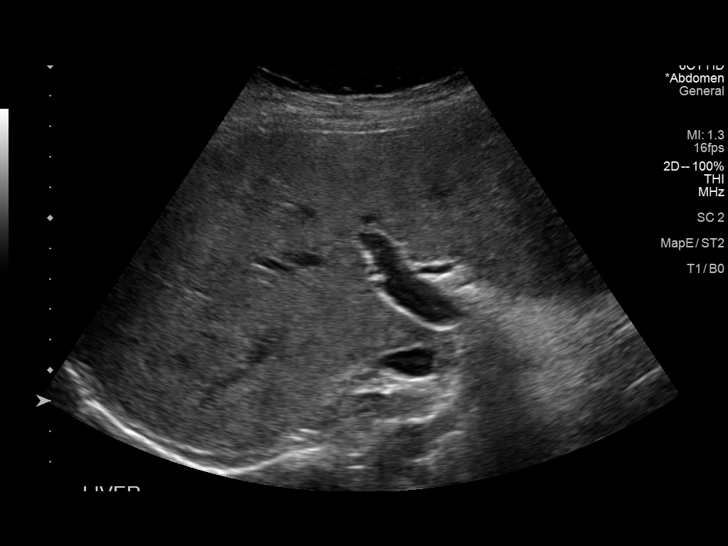
[im 38/42]
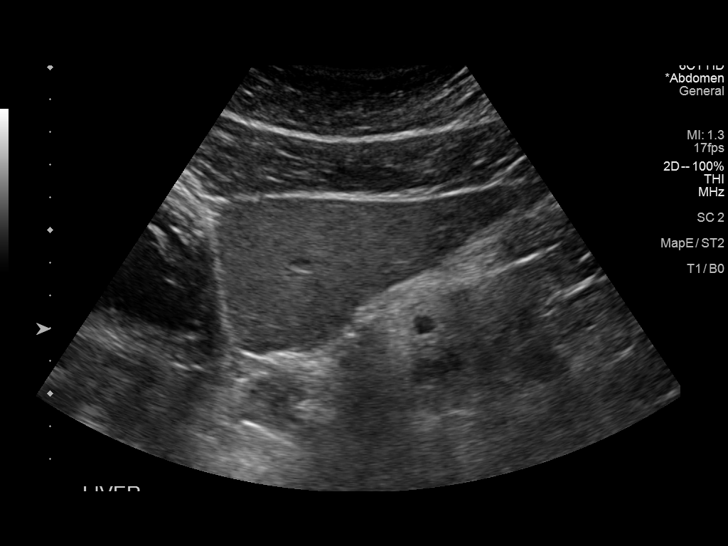
[im 42/42]
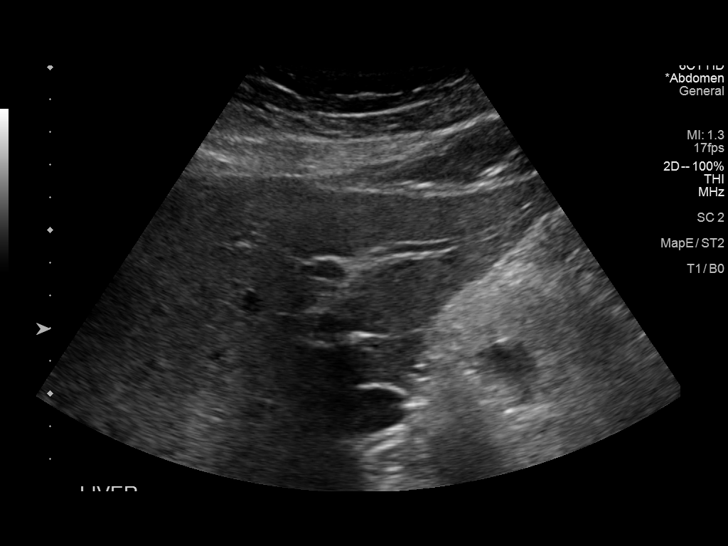

[14 of 25 positions shown; findings below may reference images not displayed]

FINDINGS: Gallbladder:

No gallstones or wall thickening visualized. There is no
pericholecystic fluid. No sonographic Murphy sign noted by
sonographer.

Common bile duct:

Diameter: 7 mm, upper normal. No intrahepatic biliary duct
dilatation. No biliary duct mass or calculus evident.

Liver:

No focal lesion identified. Within normal limits in parenchymal
echogenicity. Portal vein is patent on color Doppler imaging with
normal direction of blood flow towards the liver.

Other: None.
IMPRESSION: Study within normal limits. Note that the common bile duct is upper
normal in size.

## 2020-10-15 DIAGNOSIS — Z6829 Body mass index (BMI) 29.0-29.9, adult: Secondary | ICD-10-CM | POA: Diagnosis not present

## 2020-10-15 DIAGNOSIS — Z01419 Encounter for gynecological examination (general) (routine) without abnormal findings: Secondary | ICD-10-CM | POA: Diagnosis not present

## 2020-10-15 DIAGNOSIS — Z124 Encounter for screening for malignant neoplasm of cervix: Secondary | ICD-10-CM | POA: Diagnosis not present

## 2020-10-15 DIAGNOSIS — A6000 Herpesviral infection of urogenital system, unspecified: Secondary | ICD-10-CM | POA: Diagnosis not present

## 2020-10-15 DIAGNOSIS — R350 Frequency of micturition: Secondary | ICD-10-CM | POA: Diagnosis not present

## 2020-12-10 ENCOUNTER — Other Ambulatory Visit: Payer: BC Managed Care – PPO | Admitting: Internal Medicine

## 2020-12-10 ENCOUNTER — Other Ambulatory Visit: Payer: Self-pay

## 2020-12-10 DIAGNOSIS — R7301 Impaired fasting glucose: Secondary | ICD-10-CM | POA: Diagnosis not present

## 2020-12-10 DIAGNOSIS — Z1231 Encounter for screening mammogram for malignant neoplasm of breast: Secondary | ICD-10-CM | POA: Diagnosis not present

## 2020-12-10 DIAGNOSIS — E559 Vitamin D deficiency, unspecified: Secondary | ICD-10-CM

## 2020-12-10 DIAGNOSIS — E782 Mixed hyperlipidemia: Secondary | ICD-10-CM

## 2020-12-10 DIAGNOSIS — Z0000 Encounter for general adult medical examination without abnormal findings: Secondary | ICD-10-CM | POA: Diagnosis not present

## 2020-12-10 DIAGNOSIS — Z1329 Encounter for screening for other suspected endocrine disorder: Secondary | ICD-10-CM

## 2020-12-11 LAB — CBC WITH DIFFERENTIAL/PLATELET
Absolute Monocytes: 490 cells/uL (ref 200–950)
Basophils Absolute: 41 cells/uL (ref 0–200)
Basophils Relative: 0.6 %
Eosinophils Absolute: 48 cells/uL (ref 15–500)
Eosinophils Relative: 0.7 %
HCT: 44.6 % (ref 35.0–45.0)
Hemoglobin: 14.5 g/dL (ref 11.7–15.5)
Lymphs Abs: 2394 cells/uL (ref 850–3900)
MCH: 29.7 pg (ref 27.0–33.0)
MCHC: 32.5 g/dL (ref 32.0–36.0)
MCV: 91.4 fL (ref 80.0–100.0)
MPV: 9.8 fL (ref 7.5–12.5)
Monocytes Relative: 7.1 %
Neutro Abs: 3926 cells/uL (ref 1500–7800)
Neutrophils Relative %: 56.9 %
Platelets: 257 10*3/uL (ref 140–400)
RBC: 4.88 10*6/uL (ref 3.80–5.10)
RDW: 12.2 % (ref 11.0–15.0)
Total Lymphocyte: 34.7 %
WBC: 6.9 10*3/uL (ref 3.8–10.8)

## 2020-12-11 LAB — LIPID PANEL
Cholesterol: 192 mg/dL (ref <200)
HDL: 62 mg/dL (ref >=50)
LDL Cholesterol (Calc): 104 mg/dL (calc) — ABNORMAL HIGH
Non-HDL Cholesterol (Calc): 130 mg/dL (calc) — ABNORMAL HIGH (ref <130)
Total CHOL/HDL Ratio: 3.1 (calc) (ref <5.0)
Triglycerides: 143 mg/dL (ref <150)

## 2020-12-11 LAB — HEMOGLOBIN A1C
Hgb A1c MFr Bld: 5.3 % of total Hgb (ref <5.7)
Mean Plasma Glucose: 105 mg/dL
eAG (mmol/L): 5.8 mmol/L

## 2020-12-11 LAB — COMPLETE METABOLIC PANEL WITH GFR
AG Ratio: 1.8 (calc) (ref 1.0–2.5)
ALT: 12 U/L (ref 6–29)
AST: 14 U/L (ref 10–30)
Albumin: 4.5 g/dL (ref 3.6–5.1)
Alkaline phosphatase (APISO): 56 U/L (ref 31–125)
BUN: 14 mg/dL (ref 7–25)
CO2: 28 mmol/L (ref 20–32)
Calcium: 9.4 mg/dL (ref 8.6–10.2)
Chloride: 103 mmol/L (ref 98–110)
Creat: 0.78 mg/dL (ref 0.50–0.99)
Globulin: 2.5 g/dL (calc) (ref 1.9–3.7)
Glucose, Bld: 87 mg/dL (ref 65–99)
Potassium: 4.3 mmol/L (ref 3.5–5.3)
Sodium: 139 mmol/L (ref 135–146)
Total Bilirubin: 0.5 mg/dL (ref 0.2–1.2)
Total Protein: 7 g/dL (ref 6.1–8.1)
eGFR: 97 mL/min/{1.73_m2} (ref >=60)

## 2020-12-11 LAB — TSH: TSH: 1.81 mIU/L

## 2020-12-17 ENCOUNTER — Ambulatory Visit (INDEPENDENT_AMBULATORY_CARE_PROVIDER_SITE_OTHER): Payer: BC Managed Care – PPO | Admitting: Internal Medicine

## 2020-12-17 ENCOUNTER — Encounter: Payer: Self-pay | Admitting: Internal Medicine

## 2020-12-17 ENCOUNTER — Other Ambulatory Visit: Payer: Self-pay

## 2020-12-17 VITALS — BP 112/80 | HR 76 | Temp 98.7°F | Ht 62.75 in | Wt 173.0 lb

## 2020-12-17 DIAGNOSIS — Z9889 Other specified postprocedural states: Secondary | ICD-10-CM

## 2020-12-17 DIAGNOSIS — Z0000 Encounter for general adult medical examination without abnormal findings: Secondary | ICD-10-CM

## 2020-12-17 DIAGNOSIS — Z803 Family history of malignant neoplasm of breast: Secondary | ICD-10-CM | POA: Diagnosis not present

## 2020-12-17 DIAGNOSIS — Z6830 Body mass index (BMI) 30.0-30.9, adult: Secondary | ICD-10-CM

## 2020-12-17 LAB — POCT URINALYSIS DIPSTICK
Bilirubin, UA: NEGATIVE
Blood, UA: NEGATIVE
Glucose, UA: NEGATIVE
Ketones, UA: NEGATIVE
Leukocytes, UA: NEGATIVE
Nitrite, UA: NEGATIVE
Protein, UA: NEGATIVE
Spec Grav, UA: 1.015 (ref 1.010–1.025)
Urobilinogen, UA: 0.2 E.U./dL
pH, UA: 6 (ref 5.0–8.0)

## 2020-12-17 NOTE — Progress Notes (Signed)
   Subjective:    Patient ID: Brooke Bryan, female    DOB: [birth year], 45 y.o.   MRN: [id number]  HPI 45 year old Female  for health maintenance exam and evaluation of medical issues.  She has been a patient in this practice since she was 45 years old.  No known drug allergies.  History of breast reduction surgery in 2020.  Is followed at [facility] for GYN care.  He has mammogram through that office.  History of chickenpox in 43.  Had recurrent urinary infections in childhood.  LEEP procedure in 2002 for abnormal Pap smear at [facility].  History of Herpes simplex type II.  History of vertigo in May 2016 that resolved with conservative therapy.  No history of serious illnesses.  No known drug allergies.  Had strep pharyngitis in March 2018 in July 2017.  She has tried various diets for weight loss.  Currently BMI is 30.89 and she weighs 173 pounds.  Last year he weighed 185 pounds.  I am pleased with her weight loss.  She will continue to work on it.  Some 6 years ago she had hemoglobin A1c of 5.7% but now hemoglobin A1c for the past several years has been within normal limits and currently is 5.3%  Social history: She is married.  She has 3 children, 2 sons and a daughter.  1 son has translocation of chromosomes 1 and 27.  She does not smoke.  Social alcohol consumption.  She works for [employer].  She attended [place] and majored in Chief Financial Officer.  1 son has had addiction issues which has been stressful.  Family history: Father with history of hyperlipidemia, hypertension and hip arthroplasty.  1 brother in good health.  Mother with history of breast cancer as well as maternal grandmother.   Flu vaccine up-to-date.  Tetanus immunization up-to-date.  COVID vaccines not on file.     Review of Systems Needs to take 5000 units Vitamin D 3 daily.  This level was not checked today due to expense.  Had mammogram at GYN and Pap as well.     Objective:    Physical Exam Blood pressure 112/80 pulse 76 temperature 98.7 degrees BMI 30.89  Skin: Warm and dry.  Nodes none.  TMs clear.  Neck is supple without JVD thyromegaly or carotid bruits.  Chest is clear to auscultation.  Cardiac exam: Regular rate and rhythm without ectopy or murmur.  Abdomen is soft nondistended without hepatosplenomegaly masses or tenderness.  GYN exam deferred to GYN physician.  No lower extremity pitting edema.  Affect thought and judgment are normal.  Neurological exam is intact without focal deficits.       Assessment & Plan:   Currently no evidence of impaired glucose tolerance.  In 2021 she had significant hyperlipidemia with total cholesterol 243, triglycerides 194 and LDL cholesterol of 152.  This is now essentially normalized with diet and exercise.  She is not on statin medication.  Tetanus immunization is up-to-date.  Received flu vaccine.  Plan: Return in 1 year or as needed.

## 2021-01-18 NOTE — Patient Instructions (Signed)
Congratulations on weight loss efforts.  Continue to work on that.  Lipid panel has normalized.  We are not going to start statin medication since this is improved.  Return in 1 year or as needed.

## 2021-05-26 ENCOUNTER — Telehealth: Payer: Self-pay

## 2021-05-26 ENCOUNTER — Ambulatory Visit (INDEPENDENT_AMBULATORY_CARE_PROVIDER_SITE_OTHER): Payer: BC Managed Care – PPO | Admitting: Internal Medicine

## 2021-05-26 VITALS — BP 124/78 | HR 100 | Temp 98.2°F | Resp 16 | Ht 62.0 in | Wt 183.8 lb

## 2021-05-26 DIAGNOSIS — F439 Reaction to severe stress, unspecified: Secondary | ICD-10-CM

## 2021-05-26 DIAGNOSIS — R42 Dizziness and giddiness: Secondary | ICD-10-CM | POA: Diagnosis not present

## 2021-05-26 NOTE — Telephone Encounter (Signed)
Scheduled

## 2021-05-26 NOTE — Telephone Encounter (Signed)
Patient called in with clogged ears. She tried to get it out at home with kit and was not successful. She is having bouts of vertigo and thinks maybe that she may have an inner ear infection. No signs other than clogged ears. She would like appt. ?

## 2021-05-27 ENCOUNTER — Telehealth: Payer: Self-pay

## 2021-05-27 MED ORDER — ALPRAZOLAM 0.5 MG PO TABS: ORAL_TABLET | ORAL | 0 refills | Status: DC

## 2021-05-27 NOTE — Telephone Encounter (Signed)
Patient is waiting for her medications to be sent to pharmacy from yesterdays visits.  ?

## 2021-06-03 NOTE — Progress Notes (Signed)
? ?  Subjective:  ? ? Patient ID: Brooke Bryan, female    DOB: 1975/12/20, 46 y.o.   MRN: [id number] ? ?HPI 46 year old Female complaining of ear fullness with dizziness. There is some situational stress with her daughter. Son was in [employer], had some issues, and might be allowed to rejoin in the future. She is concerned about him. She is working full time with [employer]. 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 She is married. NKDA. Seen at [facility] for [place] and receives mammogram through that office. Last one on file is 2021 through that office and filed in [place]. We will call to see when last one was done there. ? ?No fever, cough, sore throat or significant headaches. ? ?She had labs done in November 2022 including TSH which was normal, CBC with differential which was normal and c-Met which was normal.  She had very mild elevation of LDL cholesterol at 104 otherwise lipid panel was normal.  Hemoglobin A1c was normal. ? ?Past medical history: Chickenpox in 1986.  Recurrent urinary tract infections in childhood.  LEEP procedure by Dr. [place] in 2002 for abnormal Pap smear.  History of HSV type II. ? ?Family history: Mother diagnosed with breast cancer at age 84.  Maternal grandmother with history of breast cancer much later in life in her 58s or 52s. ? ?Review of Systems see above- intermittent dizziness which is disconcerting to her. ? ?   ?Objective:  ? Physical Exam ?Blood pressure 124/78 right arm pulse 100 respiratory rate 16 temperature 98.2 degrees pulse oximetry 97% weight 183 pounds 12.8 ounces BMI 33.62 ? ?Skin: Warm and dry.  Nodes none.  Extraocular movements are full.  PERRLA.  TMs are clear.  Her hearing is normal bilaterally tested with Audioscope.  Cranial nerves II through XII are grossly intact.  Muscle strength is normal.  No nystagmus demonstrated.  Alert and oriented x3.  No speech deformity. ? ? ? ?   ?Assessment & Plan:  ? ?Situational  Stress ? ?Vertigo ? ?Plan: Xanax 0.5 mg up to 3 times daily as needed for vertigo.  Call if not improving within 10 days to 2 weeks.  May want to consider counseling for situational stress. ?

## 2021-06-03 NOTE — Telephone Encounter (Signed)
Brooke Bryan has called still having issues with her ears, trouble hearing, her children says she is talking loud, she sounds like she is in a tunnel, she has trouble hearing. Would like to get a referral to ENT. Does she need to come in again? ?

## 2021-06-04 ENCOUNTER — Encounter: Payer: Self-pay | Admitting: Internal Medicine

## 2021-06-04 NOTE — Patient Instructions (Addendum)
Take Xanax 0.5 mg up to 3 times daily as needed for vertigo.  Call if not improving within 10 days to 2 weeks.  May want to consider counseling for situational stress. ? ?Addendum: Patient has called back on May 16 requesting ENT referral saying she is no better.  We will make this referral for her. ?

## 2021-06-04 NOTE — Telephone Encounter (Signed)
Sent referral to [facility]  

## 2021-06-23 ENCOUNTER — Telehealth: Payer: Self-pay

## 2021-06-23 NOTE — Telephone Encounter (Signed)
Patient was seen for vertigo/inner ear and referred to ENT. They cannot see her until Mid to late July. She think she may have an ear infection-pressure changes- and wants an appt here for evaluation. Please advise.

## 2021-06-23 NOTE — Telephone Encounter (Signed)
scheduled

## 2021-06-24 ENCOUNTER — Encounter: Payer: Self-pay | Admitting: Internal Medicine

## 2021-06-24 ENCOUNTER — Ambulatory Visit (INDEPENDENT_AMBULATORY_CARE_PROVIDER_SITE_OTHER): Payer: BC Managed Care – PPO | Admitting: Internal Medicine

## 2021-06-24 VITALS — BP 118/82 | HR 97 | Temp 97.8°F

## 2021-06-24 DIAGNOSIS — H938X3 Other specified disorders of ear, bilateral: Secondary | ICD-10-CM | POA: Diagnosis not present

## 2021-06-24 DIAGNOSIS — F439 Reaction to severe stress, unspecified: Secondary | ICD-10-CM

## 2021-06-24 MED ORDER — METHYLPREDNISOLONE ACETATE 80 MG/ML IJ SUSP
80.0000 mg | Freq: Once | INTRAMUSCULAR | Status: AC
  Administered 2021-06-24: 80 mg via INTRAMUSCULAR

## 2021-06-24 NOTE — Patient Instructions (Addendum)
Depomedrol 80 mg IM given in office today. Ears look normal to me. Consider Allergy testing. Counseling recommended for situational stress.

## 2021-06-24 NOTE — Progress Notes (Signed)
   Subjective:    Patient ID: Brooke Bryan, female    DOB: 07/05/1975, 46 y.o.   MRN: [id number]  HPI  She was seen May 8th with vertigo, was treated with Xanax and vertigo improved.Has situational stress with family discussed at length at last visit and this visit as well. Daughter is in counseling at [facility] and son is coming home from the [employer].   Intermittently ears are stopped up. Feels like in a tunnel when ears are stopped up. No sore throat, no fever.Hearing was checked at last visit here with Audioscope and was normal.  Does not have vertigo at this time.Taking Xanax sparingly.  Have encouraged patient to consider counseling. Still waiting on ENT evaluation.    Review of Systems now taking Xanax occasionally now at night. Vertigo improved.      Objective:   Physical Exam Afebrile, BP 118/82, pulse 97 pulse ox 97%. TMs are clear bilaterally. Pharynx is clear. Neck is supple. Chest is clear.  Affect is slightly stressed. At times frustrated with son, daughter and husband.     Assessment & Plan:  ENT evaluation pending for c/o stopped up ears.  Situational stress with family  Depomedrol 80 mg IM given in office today for c.o ear congestion  Vertigo resolved- I do not think she has Meniere's disease  Consider Allergy evaluation if sxs persist  Patient encouraged to consider counseling for situational stress. Also, consider antidepressant if stress persists.  30 minutes spend with patient in listening to her stressors and concerns as well as PE, evaluation and treatment options discussed

## 2021-08-04 DIAGNOSIS — H6983 Other specified disorders of Eustachian tube, bilateral: Secondary | ICD-10-CM | POA: Diagnosis not present

## 2021-09-25 DIAGNOSIS — F4322 Adjustment disorder with anxiety: Secondary | ICD-10-CM | POA: Diagnosis not present

## 2021-09-29 DIAGNOSIS — F4322 Adjustment disorder with anxiety: Secondary | ICD-10-CM | POA: Diagnosis not present

## 2021-10-06 DIAGNOSIS — F4322 Adjustment disorder with anxiety: Secondary | ICD-10-CM | POA: Diagnosis not present

## 2021-10-21 DIAGNOSIS — Z01419 Encounter for gynecological examination (general) (routine) without abnormal findings: Secondary | ICD-10-CM | POA: Diagnosis not present

## 2021-10-21 DIAGNOSIS — Z124 Encounter for screening for malignant neoplasm of cervix: Secondary | ICD-10-CM | POA: Diagnosis not present

## 2021-10-21 DIAGNOSIS — Z01411 Encounter for gynecological examination (general) (routine) with abnormal findings: Secondary | ICD-10-CM | POA: Diagnosis not present

## 2021-10-30 ENCOUNTER — Encounter: Payer: Self-pay | Admitting: Internal Medicine

## 2021-10-30 ENCOUNTER — Ambulatory Visit (INDEPENDENT_AMBULATORY_CARE_PROVIDER_SITE_OTHER): Payer: BC Managed Care – PPO | Admitting: Internal Medicine

## 2021-10-30 ENCOUNTER — Telehealth: Payer: Self-pay | Admitting: Internal Medicine

## 2021-10-30 VITALS — BP 122/84 | HR 102 | Resp 16 | Ht 62.0 in | Wt 179.8 lb

## 2021-10-30 DIAGNOSIS — H6500 Acute serous otitis media, unspecified ear: Secondary | ICD-10-CM

## 2021-10-30 MED ORDER — ONDANSETRON HCL 4 MG PO TABS: 4.0000 mg | ORAL_TABLET | Freq: Three times a day (TID) | ORAL | 0 refills | Status: DC | PRN

## 2021-10-30 MED ORDER — HYDROCODONE BIT-HOMATROP MBR 5-1.5 MG/5ML PO SOLN: 5.0000 mL | Freq: Three times a day (TID) | ORAL | 0 refills | Status: DC | PRN

## 2021-10-30 MED ORDER — AZITHROMYCIN 250 MG PO TABS: ORAL_TABLET | ORAL | 0 refills | Status: AC

## 2021-10-30 NOTE — Telephone Encounter (Signed)
Brooke Bryan, [phone]  [name] called to say on Saturday she started having tickle in her throat then on Tuesday she had body aches, runny nose, sneezing, tired, and she sounds sick. She done a COVID test on Tuesday and [place] that were negative. She would like to be seen tomorrow to be treated because she has got to leave town on Sunday for work. I have ask her to do another COVID test, and scheduled her for 4:00. I let her know the results of COvID test determines what kind of visit and treatment.

## 2021-10-30 NOTE — Progress Notes (Signed)
   Subjective:    Patient ID: Brooke Bryan, female    DOB: [birth year], 46 y.o.   MRN: [id number]  HPI Patient came down with runny nose  sneezing and body aches with low grade fever on Saturday October 5. Felt better but then drove to [place] on Tuesday and spent night in hotel. Felt poorly again on Wednesday. Has not felt  well since. Has felt nauseated at times.  She took 2 home COVID tests which were both negative.  She has a history of bilateral ear pressure and vertigo.    She saw ENT physician in July complaining of ears feeling full and clogged for some 4 months.  Was felt to have bilateral intermittent eustachian tube dysfunction and was recommended to use Flonase at bedtime and was given gentle auto inflation exercises to use when ears felt full.  Offered to check hearing but patient declined.  Recommended using Afrin nasal spray prior to flying.    Review of Systems see Olga Millers has to continue working this week.     Objective:   Physical Exam She is afebrile.  Blood pressure is 122/84 pulse 102-she is slightly anxious, respiratory rate 16, pulse oximetry 99%  Skin warm and dry.  Both TMs are slightly full.  Pharynx is clear.  Neck is supple.  No cervical adenopathy.  Chest is clear.     Assessment & Plan:  Acute bilateral serous otitis media  Plan: Zofran 4 mg tablets by mouth every 8 hours as needed for nausea.-Then 1 teaspoon every 8 hours if needed for cough.  Zithromax Z-PAK to take 2 tabs day 1 followed by 1 tab days 2 through 5.  Rest and stay well-hydrated.

## 2021-10-31 ENCOUNTER — Ambulatory Visit: Payer: BC Managed Care – PPO | Admitting: Internal Medicine

## 2021-11-01 NOTE — Patient Instructions (Addendum)
Zithromax Z-PAK 2 tabs day 1 followed by 1 tab days 2 through 5.  Hycodan 1 teaspoon every 8 hours as needed for cough.  Zofran 4 mg tablets if needed for nausea.  Rest and drink plenty of fluids.

## 2021-12-16 ENCOUNTER — Other Ambulatory Visit: Payer: BC Managed Care – PPO

## 2021-12-16 DIAGNOSIS — R5383 Other fatigue: Secondary | ICD-10-CM | POA: Diagnosis not present

## 2021-12-16 DIAGNOSIS — E559 Vitamin D deficiency, unspecified: Secondary | ICD-10-CM

## 2021-12-16 DIAGNOSIS — Z13220 Encounter for screening for lipoid disorders: Secondary | ICD-10-CM | POA: Diagnosis not present

## 2021-12-16 DIAGNOSIS — Z0000 Encounter for general adult medical examination without abnormal findings: Secondary | ICD-10-CM

## 2021-12-16 DIAGNOSIS — Z136 Encounter for screening for cardiovascular disorders: Secondary | ICD-10-CM

## 2021-12-17 ENCOUNTER — Other Ambulatory Visit: Payer: Self-pay

## 2021-12-17 DIAGNOSIS — E559 Vitamin D deficiency, unspecified: Secondary | ICD-10-CM

## 2021-12-17 LAB — LIPID PANEL
Cholesterol: 191 mg/dL (ref <200)
HDL: 60 mg/dL (ref >=50)
LDL Cholesterol (Calc): 108 mg/dL (calc) — ABNORMAL HIGH
Non-HDL Cholesterol (Calc): 131 mg/dL (calc) — ABNORMAL HIGH (ref <130)
Total CHOL/HDL Ratio: 3.2 (calc) (ref <5.0)
Triglycerides: 123 mg/dL (ref <150)

## 2021-12-17 LAB — CBC WITH DIFFERENTIAL/PLATELET
Absolute Monocytes: 455 cells/uL (ref 200–950)
Basophils Absolute: 59 cells/uL (ref 0–200)
Basophils Relative: 0.9 %
Eosinophils Absolute: 39 cells/uL (ref 15–500)
Eosinophils Relative: 0.6 %
HCT: 41.3 % (ref 35.0–45.0)
Hemoglobin: 13.9 g/dL (ref 11.7–15.5)
Lymphs Abs: 2613 cells/uL (ref 850–3900)
MCH: 29.8 pg (ref 27.0–33.0)
MCHC: 33.7 g/dL (ref 32.0–36.0)
MCV: 88.4 fL (ref 80.0–100.0)
MPV: 9.8 fL (ref 7.5–12.5)
Monocytes Relative: 7 %
Neutro Abs: 3335 cells/uL (ref 1500–7800)
Neutrophils Relative %: 51.3 %
Platelets: 267 10*3/uL (ref 140–400)
RBC: 4.67 10*6/uL (ref 3.80–5.10)
RDW: 12.2 % (ref 11.0–15.0)
Total Lymphocyte: 40.2 %
WBC: 6.5 10*3/uL (ref 3.8–10.8)

## 2021-12-17 LAB — COMPLETE METABOLIC PANEL WITH GFR
AG Ratio: 1.7 (calc) (ref 1.0–2.5)
ALT: 16 U/L (ref 6–29)
AST: 16 U/L (ref 10–30)
Albumin: 4.5 g/dL (ref 3.6–5.1)
Alkaline phosphatase (APISO): 57 U/L (ref 31–125)
BUN: 13 mg/dL (ref 7–25)
CO2: 25 mmol/L (ref 20–32)
Calcium: 9.5 mg/dL (ref 8.6–10.2)
Chloride: 104 mmol/L (ref 98–110)
Creat: 0.7 mg/dL (ref 0.50–0.99)
Globulin: 2.7 g/dL (calc) (ref 1.9–3.7)
Glucose, Bld: 93 mg/dL (ref 65–99)
Potassium: 4.2 mmol/L (ref 3.5–5.3)
Sodium: 139 mmol/L (ref 135–146)
Total Bilirubin: 0.5 mg/dL (ref 0.2–1.2)
Total Protein: 7.2 g/dL (ref 6.1–8.1)
eGFR: 109 mL/min/{1.73_m2} (ref >=60)

## 2021-12-17 LAB — TSH: TSH: 2.44 mIU/L

## 2021-12-17 NOTE — Addendum Note (Signed)
Addended by: Mary Sella D on: 12/17/2021 02:40 PM   Modules accepted: Orders

## 2021-12-19 ENCOUNTER — Ambulatory Visit (INDEPENDENT_AMBULATORY_CARE_PROVIDER_SITE_OTHER): Payer: BC Managed Care – PPO | Admitting: Internal Medicine

## 2021-12-19 VITALS — BP 120/74 | HR 86 | Temp 99.1°F | Ht 62.0 in | Wt 180.0 lb

## 2021-12-19 DIAGNOSIS — Z803 Family history of malignant neoplasm of breast: Secondary | ICD-10-CM | POA: Diagnosis not present

## 2021-12-19 DIAGNOSIS — Z0000 Encounter for general adult medical examination without abnormal findings: Secondary | ICD-10-CM | POA: Diagnosis not present

## 2021-12-19 DIAGNOSIS — Z9889 Other specified postprocedural states: Secondary | ICD-10-CM | POA: Diagnosis not present

## 2021-12-19 DIAGNOSIS — Z6832 Body mass index (BMI) 32.0-32.9, adult: Secondary | ICD-10-CM | POA: Diagnosis not present

## 2021-12-19 LAB — POCT URINALYSIS DIPSTICK
Bilirubin, UA: NEGATIVE
Blood, UA: NEGATIVE
Glucose, UA: NEGATIVE
Ketones, UA: NEGATIVE
Leukocytes, UA: NEGATIVE
Nitrite, UA: NEGATIVE
Protein, UA: NEGATIVE
Spec Grav, UA: 1.015 (ref 1.010–1.025)
Urobilinogen, UA: 0.2 E.U./dL
pH, UA: 6.5 (ref 5.0–8.0)

## 2021-12-19 NOTE — Patient Instructions (Addendum)
Labs are normal except for mild elevation of LDL cholesterol at 108 normal being less than 100.  Continue to work on diet and exercise.  Tetanus immunization is up-to-date.

## 2021-12-19 NOTE — Progress Notes (Signed)
   Subjective:    Patient ID: Brooke Bryan, female    DOB: 02-04-1975, 46 y.o.   MRN: [id number]  HPI 46 year old Female seen for health maintenance and evaluation of medical issues.  She has been a patient in this practice since she is was 46 years old.  History of breast reduction surgery in 2020.  She is followed also at [facility] for GYN care.  No known drug allergies.  History of chickenpox in 33.  History of recurrent urinary infections in childhood.  LEEP procedure in 2002 for abnormal Pap smear.  History of Herpes simplex type II.  History of vertigo in May 2016 that resolved with conservative therapy.  Strep pharyngitis in March 2018 and July 2017.  She has tried various diets for weight loss.  History of mild glucose intolerance some 6 or 7 years ago with hemoglobin A1c 5.7% but for the past several years hemoglobin A1c has been stable and was 5.3% in 2022.  It was not checked with this visit but her serum glucose was normal at 93.  Social history: She is married.  She has 3 children, 2 sons and a daughter.  1 son has translocation of chromosomes 1 and 27.  She does not smoke.  Social alcohol consumption.  She works for [employer].  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  1 son has had addiction issues which has been stressful.  Family history: Father with history of hyperlipidemia, hypertension and hip arthroplasty.  1 brother in good health.  Mother with history of breast cancer as well as maternal grandmother.   Immunizations reviewed.  Not vaccinated for COVID-19.  Tetanus immunization is up-to-date.   Review of Systems no new complaints     Objective:   Physical Exam  Blood pressure 120/74, pulse 86 regular temperature 99.1 degrees pulse oximetry 99% weight 180 pounds.  Height 5 feet 2 inches BMI 32.92  Skin: Warm and dry.  No cervical adenopathy or thyromegaly.  Chest clear.  Cardiac exam: Regular rate and rhythm.  Breast are without  masses.  Abdomen is soft nondistended without hepatosplenomegaly masses or tenderness.  No lower extremity pitting edema.  Brief neurological exam is intact without gross focal deficits.      Assessment & Plan:  Normal health maintenance exam  Not vaccinated for COVID-19  Tetanus immunization is up-to-date  Mild elevation of LDL cholesterol at 108-other labs are normal  BMI 32-continue diet and exercise efforts  Plan: Return in 1 year or as needed.  Continue diet and exercise efforts.

## 2021-12-23 DIAGNOSIS — Z1231 Encounter for screening mammogram for malignant neoplasm of breast: Secondary | ICD-10-CM | POA: Diagnosis not present

## 2021-12-23 LAB — HM MAMMOGRAPHY

## 2021-12-24 ENCOUNTER — Encounter: Payer: Self-pay | Admitting: Internal Medicine

## 2022-01-13 ENCOUNTER — Encounter: Payer: Self-pay | Admitting: Internal Medicine

## 2022-01-29 DIAGNOSIS — F411 Generalized anxiety disorder: Secondary | ICD-10-CM | POA: Diagnosis not present

## 2022-02-12 DIAGNOSIS — F411 Generalized anxiety disorder: Secondary | ICD-10-CM | POA: Diagnosis not present

## 2022-02-16 DIAGNOSIS — F411 Generalized anxiety disorder: Secondary | ICD-10-CM | POA: Diagnosis not present

## 2022-02-23 DIAGNOSIS — F411 Generalized anxiety disorder: Secondary | ICD-10-CM | POA: Diagnosis not present

## 2022-03-05 DIAGNOSIS — F411 Generalized anxiety disorder: Secondary | ICD-10-CM | POA: Diagnosis not present

## 2022-03-12 DIAGNOSIS — F411 Generalized anxiety disorder: Secondary | ICD-10-CM | POA: Diagnosis not present

## 2022-03-23 DIAGNOSIS — F411 Generalized anxiety disorder: Secondary | ICD-10-CM | POA: Diagnosis not present

## 2022-03-30 DIAGNOSIS — F411 Generalized anxiety disorder: Secondary | ICD-10-CM | POA: Diagnosis not present

## 2022-04-05 ENCOUNTER — Other Ambulatory Visit: Payer: Self-pay | Admitting: Internal Medicine

## 2022-04-13 DIAGNOSIS — F411 Generalized anxiety disorder: Secondary | ICD-10-CM | POA: Diagnosis not present

## 2022-06-04 ENCOUNTER — Encounter: Payer: Self-pay | Admitting: Internal Medicine

## 2022-06-04 ENCOUNTER — Ambulatory Visit (INDEPENDENT_AMBULATORY_CARE_PROVIDER_SITE_OTHER): Payer: BC Managed Care – PPO | Admitting: Internal Medicine

## 2022-06-04 ENCOUNTER — Telehealth: Payer: Self-pay

## 2022-06-04 VITALS — BP 106/80 | HR 80 | Temp 98.5°F | Ht 62.0 in | Wt 177.8 lb

## 2022-06-04 DIAGNOSIS — L308 Other specified dermatitis: Secondary | ICD-10-CM

## 2022-06-04 DIAGNOSIS — B028 Zoster with other complications: Secondary | ICD-10-CM

## 2022-06-04 MED ORDER — VALACYCLOVIR HCL 1 G PO TABS: ORAL_TABLET | ORAL | Status: DC

## 2022-06-04 MED ORDER — VALACYCLOVIR HCL 1 G PO TABS: ORAL_TABLET | ORAL | 0 refills | Status: DC

## 2022-06-04 MED ORDER — VALACYCLOVIR HCL 1 G PO TABS: ORAL_TABLET | ORAL | 0 refills | Status: AC

## 2022-06-04 NOTE — Telephone Encounter (Signed)
Patient called stating she has a rash under her arm started last week,  she said its painful and throbbing and would like to be seen I advised patient a message would be sent and we will call her back to schedule

## 2022-06-04 NOTE — Progress Notes (Addendum)
Patient Care Team: Margaree Mackintosh, MD as PCP - General (Internal Medicine)  Visit Date: 06/04/22  Subjective:    Patient ID: Brooke Bryan , Female   DOB: 04/05/1975, 47 y.o.    MRN: [id number]   47 y.o. Female presents today for painful rash under right arm since 05/29/22. Started as an itchy rash and now only painful. Has tried cortisone, benadryl cream without relief.  Past Medical History:  Diagnosis Date   Anxiety    Fatigue    Herpes simplex virus type 1 (HSV-1) dermatitis    History of abnormal Pap smear    Pre-diabetes    Vitamin D deficiency      Family History  Problem Relation Age of Onset   Cancer Maternal Grandmother    Thyroid disease Mother    Cancer Mother    Alcoholism Mother    Hypertension Father    Hyperlipidemia Father    AAA (abdominal aortic aneurysm) Father     Social History   Social History Narrative   Not on file      Review of Systems  Constitutional:  Negative for fever and malaise/fatigue.  HENT:  Negative for congestion.   Eyes:  Negative for blurred vision.  Respiratory:  Negative for cough and shortness of breath.   Cardiovascular:  Negative for chest pain, palpitations and leg swelling.  Gastrointestinal:  Negative for vomiting.  Musculoskeletal:  Negative for back pain.  Skin:  Positive for rash.  Neurological:  Negative for loss of consciousness and headaches.        Objective:   Vitals: BP 106/80   Pulse 80   Temp 98.5 F (36.9 C) (Tympanic)   Ht 5\' 2"  (1.575 m)   Wt 177 lb 12.8 oz (80.6 kg)   LMP 05/17/2022   SpO2 98%   BMI 32.52 kg/m    Physical Exam Vitals and nursing note reviewed.  Constitutional:      General: She is not in acute distress.    Appearance: Normal appearance. She is not toxic-appearing.  HENT:     Head: Normocephalic and atraumatic.  Pulmonary:     Effort: Pulmonary effort is normal.  Skin:    General: Skin is warm and dry.     Comments: Macular erythematous rash right axilla-  pattern consistent with herpes zoster.  Neurological:     Mental Status: She is alert and oriented to person, place, and time. Mental status is at baseline.  Psychiatric:        Mood and Affect: Mood normal.        Behavior: Behavior normal.        Thought Content: Thought content normal.        Judgment: Judgment normal.       Results:   Studies obtained and personally reviewed by me:   Labs:       Component Value Date/Time   NA 139 12/16/2021 0908   K 4.2 12/16/2021 0908   CL 104 12/16/2021 0908   CO2 25 12/16/2021 0908   GLUCOSE 93 12/16/2021 0908   BUN 13 12/16/2021 0908   CREATININE 0.70 12/16/2021 0908   CALCIUM 9.5 12/16/2021 0908   PROT 7.2 12/16/2021 0908   ALBUMIN 4.5 07/12/2014 0941   AST 16 12/16/2021 0908   ALT 16 12/16/2021 0908   ALKPHOS 56 07/12/2014 0941   BILITOT 0.5 12/16/2021 0908   GFRNONAA 92 02/24/2019 0938   GFRAA 106 02/24/2019 0938     Lab Results  Component Value Date   WBC 6.5 12/16/2021   HGB 13.9 12/16/2021   HCT 41.3 12/16/2021   MCV 88.4 12/16/2021   PLT 267 12/16/2021    Lab Results  Component Value Date   CHOL 191 12/16/2021   HDL 60 12/16/2021   LDLCALC 108 (H) 12/16/2021   TRIG 123 12/16/2021   CHOLHDL 3.2 12/16/2021    Lab Results  Component Value Date   HGBA1C 5.3 12/10/2020     Lab Results  Component Value Date   TSH 2.44 12/16/2021      Assessment & Plan:   Herpes zoster: prescribed Valtrex 1000 mg three times daily for seven days. Try not to shave this area. Can use hydrocortisone cream. Contact us if symptoms do not improve in 48-72 hours. Can use hydrocortisone cream topically 3 times daily    I,Alexander Ruley,acting as a scribe for Margaree Mackintosh, MD.,have documented all relevant documentation on the behalf of Margaree Mackintosh, MD,as directed by  Margaree Mackintosh, MD while in the presence of Margaree Mackintosh, MD.   I, Margaree Mackintosh, MD, have reviewed all documentation for this visit. The documentation on  06/06/22 for the exam, diagnosis, procedures, and orders are all accurate and complete.

## 2022-06-04 NOTE — Telephone Encounter (Signed)
scheduled

## 2022-06-06 NOTE — Patient Instructions (Addendum)
Take Valtrex 1000 mg 3 times daily x 7 days. Call in not improving in 48-72 hours

## 2022-09-15 ENCOUNTER — Telehealth: Payer: Self-pay | Admitting: Internal Medicine

## 2022-09-15 ENCOUNTER — Telehealth: Payer: BC Managed Care – PPO | Admitting: Nurse Practitioner

## 2022-09-15 DIAGNOSIS — J069 Acute upper respiratory infection, unspecified: Secondary | ICD-10-CM | POA: Diagnosis not present

## 2022-09-15 NOTE — Telephone Encounter (Signed)
Shawntel Canche [phone]  Baylyn called to say she started getting sick on Sunday with cough, fever, body aches, just not feeling well. I ask her to go ahead and do a COVID test because this was what we had been seeing lately with COVID, and I let her know Dr Lenord Fellers was out of office and I suggested after doing the COVID test, that she set up and do a Urgent Virtual Video visit thru MyChart and walked her thru how to do that.

## 2022-09-15 NOTE — Progress Notes (Signed)
Virtual Visit Consent   Brooke Bryan, you are scheduled for a virtual visit with a [facility] provider today. Just as with appointments in the office, your consent must be obtained to participate. Your consent will be active for this visit and any virtual visit you may have with one of our providers in the next 365 days. If you have a MyChart account, a copy of this consent can be sent to you electronically.  As this is a virtual visit, video technology does not allow for your provider to perform a traditional examination. This may limit your provider's ability to fully assess your condition. If your provider identifies any concerns that need to be evaluated in person or the need to arrange testing (such as labs, EKG, etc.), we will make arrangements to do so. Although advances in technology are sophisticated, we cannot ensure that it will always work on either your end or our end. If the connection with a video visit is poor, the visit may have to be switched to a telephone visit. With either a video or telephone visit, we are not always able to ensure that we have a secure connection.  By engaging in this virtual visit, you consent to the provision of healthcare and authorize for your insurance to be billed (if applicable) for the services provided during this visit. Depending on your insurance coverage, you may receive a charge related to this service.  I need to obtain your verbal consent now. Are you willing to proceed with your visit today? LOUBERTA HELDRETH has provided verbal consent on 09/15/2022 for a virtual visit (video or telephone). Viviano Simas, FNP  Date: 09/15/2022 9:30 AM  Virtual Visit via Video Note   I, Viviano Simas, connected with  Brooke Bryan  ([id number], 02-Nov-1975) on 09/15/22 at 10:00 AM EDT by a video-enabled telemedicine application and verified that I am speaking with the correct person using two identifiers.  Location: Patient: Virtual Visit Location Patient:  Home Provider: Virtual Visit Location Provider: Home Office   I discussed the limitations of evaluation and management by telemedicine and the availability of in person appointments. The patient expressed understanding and agreed to proceed.    History of Present Illness: Brooke Bryan is a 47 y.o. who identifies as a female who was assigned female at birth, and is being seen today for chills body aches and a cough   Symptom onset was three days ago  Last night she started to develop a fever and chills, body aches  She has a cough today and PND Woke up with a headache that has resolved now   She took a COVID test this morning and it was negative.   She took Dayquil about three hours ago and feels fever returning   She has been exposed to other family members with unknown diagnosis  Son has similar symptoms went to Peds yesterday and was negative for COVID and Flu   Denies a history of asthma or need for inhalers   She has had COVID in the past   Problems:  Patient Active Problem List   Diagnosis Date Noted   Vitamin D deficiency 03/28/2018   Prediabetes 03/28/2018   Class 1 obesity 03/28/2018   Type 2 HSV infection of vulvovaginal region 02/22/2018   Obesity 07/17/2014    Allergies: No Known Allergies Medications:  Current Outpatient Medications:    ALPRAZolam (XANAX) 0.5 MG tablet, One half to one tab up to 3 times daily as needed for  anxiety and vertigo (Patient not taking: Reported on 06/04/2022), Disp: 30 tablet, Rfl: 0   HYDROcodone bit-homatropine (HYCODAN) 5-1.5 MG/5ML syrup, Take 5 mLs by mouth every 8 (eight) hours as needed for cough. (Patient not taking: Reported on 06/04/2022), Disp: 120 mL, Rfl: 0   Multiple Vitamin (MULTIVITAMIN) tablet, Take 1 tablet by mouth daily., Disp: , Rfl:    Omega-3 Fatty Acids (FISH OIL) 1000 MG CAPS, Take by mouth. (Patient not taking: Reported on 06/04/2022), Disp: , Rfl:    ondansetron (ZOFRAN) 4 MG tablet, TAKE 1 TABLET BY MOUTH  EVERY 8 HOURS AS NEEDED FOR NAUSEA AND VOMITING (Patient not taking: Reported on 06/04/2022), Disp: 18 tablet, Rfl: 1   valACYclovir (VALTREX) 1000 MG tablet, One tab 3 times daily for 7 days for Herpes zosterOne tab 3 times daily for 7 days for Herpes zoster, Disp: 21 tablet, Rfl: 0  Observations/Objective: Patient is well-developed, well-nourished in no acute distress.  Resting comfortably  at home.  Head is normocephalic, atraumatic.  No labored breathing.  Speech is clear and coherent with logical content.  Patient is alert and oriented at baseline.    Assessment and Plan:  1. Viral upper respiratory tract infection  Continue multi symptom support with over the counter medications for relief as directed Consider Advil for relief of fever and body aches  Repeat COVID testing as discussed follow up as needed  Discussed viral process and timeline for follow up with persistent symptoms or new concerns   Push fluids, immune support as discussed      Follow Up Instructions: I discussed the assessment and treatment plan with the patient. The patient was provided an opportunity to ask questions and all were answered. The patient agreed with the plan and demonstrated an understanding of the instructions.  A copy of instructions were sent to the patient via MyChart unless otherwise noted below.    The patient was advised to call back or seek an in-person evaluation if the symptoms worsen or if the condition fails to improve as anticipated.  Time:  I spent 11 minutes with the patient via telehealth technology discussing the above problems/concerns.    Viviano Simas, FNP

## 2022-09-15 NOTE — Telephone Encounter (Signed)
Brooke Bryan called back and COVID test was negative today, she will call me in the morning on how she is feeling.

## 2022-09-16 DIAGNOSIS — J0110 Acute frontal sinusitis, unspecified: Secondary | ICD-10-CM | POA: Diagnosis not present

## 2022-09-16 DIAGNOSIS — Z20822 Contact with and (suspected) exposure to covid-19: Secondary | ICD-10-CM | POA: Diagnosis not present

## 2022-09-16 DIAGNOSIS — R509 Fever, unspecified: Secondary | ICD-10-CM | POA: Diagnosis not present

## 2022-09-17 NOTE — Telephone Encounter (Signed)
I had ask Brooke Bryan to call me back today to give me an update on how she was feeling, she done a video visit on Monday, she is not any better today, she is now running fever, cough, headache, and head and chest congestion, however she did take her son and herself and go to urgent care yesterday and her son has been sick for 13 days and he has PNA, but they tested negative for COVID, Flu and RSV. They did give them both cefdinir (OMNICEF) 300 mg capsule  for 10 days

## 2022-09-17 NOTE — Telephone Encounter (Signed)
LVM that patient should start feeling better within 48 hours of taking medicine, if she gets worse over weekend go back to urgent care and call me on Monday if she needs to be seen.

## 2022-09-22 ENCOUNTER — Encounter: Payer: Self-pay | Admitting: Internal Medicine

## 2022-09-22 ENCOUNTER — Ambulatory Visit (INDEPENDENT_AMBULATORY_CARE_PROVIDER_SITE_OTHER): Payer: BC Managed Care – PPO | Admitting: Internal Medicine

## 2022-09-22 ENCOUNTER — Ambulatory Visit
Admission: RE | Admit: 2022-09-22 | Discharge: 2022-09-22 | Disposition: A | Discharge status: Home / Self Care | Payer: BC Managed Care – PPO | Source: Ambulatory Visit | Attending: Internal Medicine | Admitting: Internal Medicine

## 2022-09-22 VITALS — BP 100/70 | HR 83 | Temp 98.1°F | Ht 62.0 in | Wt 170.0 lb

## 2022-09-22 DIAGNOSIS — J22 Unspecified acute lower respiratory infection: Secondary | ICD-10-CM | POA: Diagnosis not present

## 2022-09-22 DIAGNOSIS — R0989 Other specified symptoms and signs involving the circulatory and respiratory systems: Secondary | ICD-10-CM

## 2022-09-22 DIAGNOSIS — R059 Cough, unspecified: Secondary | ICD-10-CM | POA: Diagnosis not present

## 2022-09-22 MED ORDER — HYDROCODONE BIT-HOMATROP MBR 5-1.5 MG/5ML PO SOLN: 5.0000 mL | Freq: Three times a day (TID) | ORAL | 0 refills | Status: DC | PRN

## 2022-09-22 MED ORDER — AZITHROMYCIN 250 MG PO TABS: ORAL_TABLET | ORAL | 0 refills | Status: AC

## 2022-09-22 MED ORDER — CEFTRIAXONE SODIUM 1 G IJ SOLR
1.0000 g | Freq: Once | INTRAMUSCULAR | Status: AC
Start: 2022-09-22 — End: 2022-09-22
  Administered 2022-09-22: 1 g via INTRAMUSCULAR

## 2022-09-22 NOTE — Progress Notes (Addendum)
Patient Care Team: Margaree Mackintosh, MD as PCP - General (Internal Medicine)  Visit Date: 09/22/22  Subjective:    Patient ID: Brooke Bryan , Female   DOB: January 23, 1975, 47 y.o.    MRN: [id number]   47 y.o. Female presents today for persistent cough, malaise, head congestion, fever, chills since 09/13/22. Seen by FNP Viviano Simas on 09/15/22 and continued with OTC medications until seeing NP Hermelinda Medicus on 09/16/22 when she was started on Cefdinir. Congestion has improved. Her son has pneumonia. Denies sore throat.  Past Medical History:  Diagnosis Date   Anxiety    Fatigue    Herpes simplex virus type 1 (HSV-1) dermatitis    History of abnormal Pap smear    Pre-diabetes    Vitamin D deficiency      Family History  Problem Relation Age of Onset   Cancer Maternal Grandmother    Thyroid disease Mother    Cancer Mother    Alcoholism Mother    Hypertension Father    Hyperlipidemia Father    AAA (abdominal aortic aneurysm) Father     Social Hx: She has been a patient in this practice since she was 47 years old.  She is married.  She has 3 children, 2 sons and a daughter.  Does not smoke.  Social alcohol consumption.  She works for [employer].  She attended [place] and majored in Chief Financial Officer.     Review of Systems  Constitutional:  Positive for chills, fever and malaise/fatigue.  HENT:  Positive for congestion (Head). Negative for sore throat.   Eyes:  Negative for blurred vision.  Respiratory:  Positive for cough. Negative for shortness of breath.   Cardiovascular:  Negative for chest pain, palpitations and leg swelling.  Gastrointestinal:  Negative for vomiting.  Musculoskeletal:  Negative for back pain.  Skin:  Negative for rash.  Neurological:  Negative for loss of consciousness and headaches.        Objective:   Vitals: BP 100/70   Pulse 83   Temp 98.1 F (36.7 C)   Ht 5\' 2"  (1.575 m)   Wt 170 lb (77.1 kg)   LMP 09/04/2022   SpO2 98%   BMI  31.09 kg/m    Physical Exam Vitals and nursing note reviewed.  Constitutional:      General: She is not in acute distress.    Appearance: Normal appearance. She is not toxic-appearing.  HENT:     Head: Normocephalic and atraumatic.     Right Ear: Hearing, tympanic membrane, ear canal and external ear normal.     Left Ear: Hearing, tympanic membrane, ear canal and external ear normal.     Mouth/Throat:     Pharynx: Oropharynx is clear. No oropharyngeal exudate or posterior oropharyngeal erythema.  Pulmonary:     Effort: Pulmonary effort is normal.     Comments: Inspiratory crackles and expiratory wheezing in all fields. Skin:    General: Skin is warm and dry.  Neurological:     Mental Status: She is alert and oriented to person, place, and time. Mental status is at baseline.  Psychiatric:        Mood and Affect: Mood normal.        Behavior: Behavior normal.        Thought Content: Thought content normal.        Judgment: Judgment normal.       Results:   Studies obtained and personally reviewed by me:   Labs:  Component Value Date/Time   NA 139 12/16/2021 0908   K 4.2 12/16/2021 0908   CL 104 12/16/2021 0908   CO2 25 12/16/2021 0908   GLUCOSE 93 12/16/2021 0908   BUN 13 12/16/2021 0908   CREATININE 0.70 12/16/2021 0908   CALCIUM 9.5 12/16/2021 0908   PROT 7.2 12/16/2021 0908   ALBUMIN 4.5 07/12/2014 0941   AST 16 12/16/2021 0908   ALT 16 12/16/2021 0908   ALKPHOS 56 07/12/2014 0941   BILITOT 0.5 12/16/2021 0908   GFRNONAA 92 02/24/2019 0938   GFRAA 106 02/24/2019 0938     Lab Results  Component Value Date   WBC 6.5 12/16/2021   HGB 13.9 12/16/2021   HCT 41.3 12/16/2021   MCV 88.4 12/16/2021   PLT 267 12/16/2021    Lab Results  Component Value Date   CHOL 191 12/16/2021   HDL 60 12/16/2021   LDLCALC 108 (H) 12/16/2021   TRIG 123 12/16/2021   CHOLHDL 3.2 12/16/2021    Lab Results  Component Value Date   HGBA1C 5.3 12/10/2020     Lab  Results  Component Value Date   TSH 2.44 12/16/2021      Assessment & Plan:   Lower Respiratory Infection: prescribed Z-Pak two tabs day 1 followed by one tab days 2-5, Hycodan syrup 1 tsp every 8 hours as needed for cough. Administered Rocephin 1 g IM. Get plenty of rest and stay well-hydrated. Ordered CXR and will contact with results.  Son has been treated for possible Mycoplasma pneumonia infection with Z-Pak through local urgent care center recently.  Addendum: Chest x-ray is negative for pneumonia and patient was contacted with results by telephone.  I,Alexander Ruley,acting as a Neurosurgeon for Margaree Mackintosh, MD.,have documented all relevant documentation on the behalf of Margaree Mackintosh, MD,as directed by  Margaree Mackintosh, MD while in the presence of Margaree Mackintosh, MD.   I, Margaree Mackintosh, MD, have reviewed all documentation for this visit. The documentation on 09/27/22 for the exam, diagnosis, procedures, and orders are all accurate and complete.

## 2022-09-22 NOTE — Telephone Encounter (Signed)
Brooke Bryan called, she is still coughing up stuff, she is better, just not 100%. Would feel better if you seen her. I schedule her for this afternoon.

## 2022-09-27 NOTE — Patient Instructions (Addendum)
Please have chest x-ray today.  We will contact you with results as soon as we hear from Radiology.  Please take Zithromax Z-PAK 2 tabs day 1 followed by 1 tab days 2 through 5.  Rest and stay well-hydrated.  You have been given Rocephin 1 g IM in the office today and also a prescription for Hycodan syrup 1 teaspoon every 8 hours as needed for cough.

## 2022-09-30 DIAGNOSIS — F4323 Adjustment disorder with mixed anxiety and depressed mood: Secondary | ICD-10-CM | POA: Diagnosis not present

## 2022-10-07 DIAGNOSIS — F4323 Adjustment disorder with mixed anxiety and depressed mood: Secondary | ICD-10-CM | POA: Diagnosis not present

## 2022-10-14 DIAGNOSIS — F4323 Adjustment disorder with mixed anxiety and depressed mood: Secondary | ICD-10-CM | POA: Diagnosis not present

## 2022-10-21 DIAGNOSIS — F4323 Adjustment disorder with mixed anxiety and depressed mood: Secondary | ICD-10-CM | POA: Diagnosis not present

## 2022-10-28 DIAGNOSIS — F4323 Adjustment disorder with mixed anxiety and depressed mood: Secondary | ICD-10-CM | POA: Diagnosis not present

## 2022-11-04 DIAGNOSIS — F4323 Adjustment disorder with mixed anxiety and depressed mood: Secondary | ICD-10-CM | POA: Diagnosis not present

## 2022-11-25 LAB — RESULTS CONSOLE HPV: CHL HPV: NEGATIVE

## 2022-12-08 DIAGNOSIS — Z1331 Encounter for screening for depression: Secondary | ICD-10-CM | POA: Diagnosis not present

## 2022-12-08 DIAGNOSIS — Z01419 Encounter for gynecological examination (general) (routine) without abnormal findings: Secondary | ICD-10-CM | POA: Diagnosis not present

## 2022-12-08 DIAGNOSIS — D069 Carcinoma in situ of cervix, unspecified: Secondary | ICD-10-CM | POA: Diagnosis not present

## 2022-12-15 LAB — HM PAP SMEAR

## 2022-12-16 DIAGNOSIS — F4323 Adjustment disorder with mixed anxiety and depressed mood: Secondary | ICD-10-CM | POA: Diagnosis not present

## 2022-12-23 DIAGNOSIS — F4323 Adjustment disorder with mixed anxiety and depressed mood: Secondary | ICD-10-CM | POA: Diagnosis not present

## 2022-12-25 ENCOUNTER — Other Ambulatory Visit: Payer: BC Managed Care – PPO

## 2022-12-25 DIAGNOSIS — Z0000 Encounter for general adult medical examination without abnormal findings: Secondary | ICD-10-CM | POA: Diagnosis not present

## 2022-12-25 DIAGNOSIS — Z803 Family history of malignant neoplasm of breast: Secondary | ICD-10-CM | POA: Diagnosis not present

## 2022-12-25 DIAGNOSIS — Z1321 Encounter for screening for nutritional disorder: Secondary | ICD-10-CM

## 2022-12-25 DIAGNOSIS — E7800 Pure hypercholesterolemia, unspecified: Secondary | ICD-10-CM

## 2022-12-25 DIAGNOSIS — Z6832 Body mass index (BMI) 32.0-32.9, adult: Secondary | ICD-10-CM

## 2022-12-25 NOTE — Addendum Note (Signed)
Addended by: Gregery Na on: 12/25/2022 09:14 AM   Modules accepted: Orders

## 2022-12-25 NOTE — Progress Notes (Signed)
Patient Care Team: Margaree Mackintosh, MD as PCP - General (Internal Medicine)  Visit Date: 01/01/23  Subjective:    Patient ID: Brooke Bryan , Female   DOB: 1975/02/20, 47 y.o.    MRN: [id number]   47 y.o. Female presents today for annual comprehensive physical exam. History of anxiety, fatigue, HSV-1, abnormal Pap smear, pre-diabetes, Vitamin D deficiency.  She has been a patient in this practice since she was 47 years old.  History of breast reduction surgery in 2020.  She is followed also at [facility] for GYN care.   No known drug allergies.   History of chickenpox in 55.  History of recurrent urinary infections in childhood.  LEEP procedure in 2002 for abnormal Pap smear.  History of Herpes simplex type II.  History of vertigo in May 2016 that resolved with conservative therapy.  Strep pharyngitis in March 2018 and July 2017.   She has tried various diets for weight loss. Discussed going to [facility].   History of mild glucose intolerance some 6 or 7 years ago with hemoglobin A1c 5.7% but for the past several years hemoglobin A1c has been stable and was 5.3% in 2022.  It was not checked with this visit but her serum glucose was normal at 96.   History of Vit D deficiency of 27.  Seen at [facility] recently for her cervical cancer screening, we will get those results.   Mammogram normal on 12/29/22.  Due for first colonoscopy. Referred to [facility].   Social history: She is married.  She has 3 children, 2 sons and a daughter.  1 son has translocation of chromosomes 1 and 27.  She does not smoke.  Social alcohol consumption.  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  1 son has had addiction issues, and has been in rehab since around Thanksgiving, which has been stressful.   Family history: Father with history of hyperlipidemia, hypertension and hip  arthroplasty.  1 brother in good health.  Mother with history of breast cancer as well as maternal grandmother.  Past Medical History:  Diagnosis Date   Anxiety    Fatigue    Herpes simplex virus type 1 (HSV-1) dermatitis    History of abnormal Pap smear    Pre-diabetes    Vitamin D deficiency      Family History  Problem Relation Age of Onset   Cancer Maternal Grandmother    Thyroid disease Mother    Cancer Mother    Alcoholism Mother    Hypertension Father    Hyperlipidemia Father    AAA (abdominal aortic aneurysm) Father     Social history: She is married.  She has 3 children, 2 sons and a daughter.  1 son has translocation of chromosome 2027.  Patient does not smoke.  Social alcohol consumption.  She works for [employer].  She attended [employer] and majored in Chief Financial Officer.  1 son has had addiction issues which has been stressful.  Family history: Father with history of hyperlipidemia, hypertension and hip arthroplasty.  1 brother in good health.  Mother with history of breast cancer as well as maternal grandmother.     Review of Systems  Constitutional:  Negative for chills, fever, malaise/fatigue and weight loss.  HENT:  Negative for hearing loss, sinus pain and sore throat.   Respiratory:  Negative for cough, hemoptysis  and shortness of breath.   Cardiovascular:  Negative for chest pain, palpitations, leg swelling and PND.  Gastrointestinal:  Negative for abdominal pain, constipation, diarrhea, heartburn, nausea and vomiting.  Genitourinary:  Negative for dysuria, frequency and urgency.  Musculoskeletal:  Negative for back pain, myalgias and neck pain.  Skin:  Negative for itching and rash.  Neurological:  Negative for dizziness, tingling, seizures and headaches.  Endo/Heme/Allergies:  Negative for polydipsia.  Psychiatric/Behavioral:  Negative for depression. The patient is not nervous/anxious.         Objective:   Vitals: BP 110/80   Pulse 84   Ht  5\' 2"  (1.575 m)   Wt 178 lb (80.7 kg)   LMP 12/25/2022   SpO2 98%   BMI 32.56 kg/m    Physical Exam Vitals and nursing note reviewed.  Constitutional:      General: She is not in acute distress.    Appearance: Normal appearance. She is not ill-appearing or toxic-appearing.  HENT:     Head: Normocephalic and atraumatic.     Right Ear: Hearing, tympanic membrane, ear canal and external ear normal.     Left Ear: Hearing, tympanic membrane, ear canal and external ear normal.     Mouth/Throat:     Pharynx: Oropharynx is clear.  Eyes:     Extraocular Movements: Extraocular movements intact.     Pupils: Pupils are equal, round, and reactive to light.  Neck:     Thyroid: No thyroid mass, thyromegaly or thyroid tenderness.     Vascular: No carotid bruit.  Cardiovascular:     Rate and Rhythm: Normal rate and regular rhythm. No extrasystoles are present.    Pulses:          Dorsalis pedis pulses are 1+ on the right side and 1+ on the left side.     Heart sounds: Normal heart sounds. No murmur heard.    No friction rub. No gallop.  Pulmonary:     Effort: Pulmonary effort is normal.     Breath sounds: Normal breath sounds. No decreased breath sounds, wheezing, rhonchi or rales.  Chest:     Chest wall: No mass.  Breasts:    Right: Normal. No mass.     Left: Normal. No mass.  Abdominal:     Palpations: Abdomen is soft. There is no hepatomegaly, splenomegaly or mass.     Tenderness: There is no abdominal tenderness.     Hernia: No hernia is present.  Genitourinary:    Comments: Pelvic exam deferred to her OBGYN Musculoskeletal:     Cervical back: Normal range of motion.     Right lower leg: No edema.     Left lower leg: No edema.  Lymphadenopathy:     Cervical: No cervical adenopathy.     Upper Body:     Right upper body: No supraclavicular adenopathy.     Left upper body: No supraclavicular adenopathy.  Skin:    General: Skin is warm and dry.  Neurological:     General: No  focal deficit present.     Mental Status: She is alert and oriented to person, place, and time. Mental status is at baseline.     Sensory: Sensation is intact.     Motor: Motor function is intact. No weakness.     Deep Tendon Reflexes: Reflexes are normal and symmetric.  Psychiatric:        Attention and Perception: Attention normal.        Mood and Affect: Affect  is tearful.        Speech: Speech normal.        Behavior: Behavior normal.        Thought Content: Thought content normal.        Cognition and Memory: Cognition normal.        Judgment: Judgment normal.    Results:   Studies obtained and personally reviewed by me:  Cervical cancer screening: Seen recently by her OBGYN; we will get those results.   Mammogram: normal on 12/29/22.  Colonoscopy: Due, referred to [facility].   Urinalysis normal.   Labs:       Component Value Date/Time   NA 139 12/25/2022 0909   K 4.9 12/25/2022 0909   CL 104 12/25/2022 0909   CO2 25 12/25/2022 0909   GLUCOSE 96 12/25/2022 0909   BUN 13 12/25/2022 0909   CREATININE 0.83 12/25/2022 0909   CALCIUM 9.2 12/25/2022 0909   PROT 7.1 12/25/2022 0909   ALBUMIN 4.5 07/12/2014 0941   AST 16 12/25/2022 0909   ALT 14 12/25/2022 0909   ALKPHOS 56 07/12/2014 0941   BILITOT 0.5 12/25/2022 0909   GFRNONAA 92 02/24/2019 0938   GFRAA 106 02/24/2019 0938     Lab Results  Component Value Date   WBC 6.4 12/25/2022   HGB 13.4 12/25/2022   HCT 42.1 12/25/2022   MCV 90.3 12/25/2022   PLT 268 12/25/2022    Lab Results  Component Value Date   CHOL 187 12/25/2022   HDL 62 12/25/2022   LDLCALC 100 (H) 12/25/2022   TRIG 157 (H) 12/25/2022   CHOLHDL 3.0 12/25/2022    Lab Results  Component Value Date   HGBA1C 5.3 12/10/2020     Lab Results  Component Value Date   TSH 1.62 12/25/2022     Assessment & Plan:   Weight loss: Discussed going to [facility]. Given contact information.  BMI 32   Glucose intolerance:  some 6 or 7 years ago with hemoglobin A1c 5.7% but for the past several years hemoglobin A1c has been stable and was 5.3% in 2022.  It was not checked with this visit but her serum glucose was normal at 96.   Vitamin-D deficiency: Vitamin D of 27. Prescribed 1.25 mg ergocalciferol weekly.   Cervical cancer screening: Seen recently by her OBGYN; we will get those results.   Mammogram: normal on 12/29/22.  Colonoscopy: Due, referred to [facility].   Vaccine counseling: UTD on tetanus vaccine. Deferred Covid booster.  Return in 1 year or as needed.   I,Alexander Ruley,acting as a Neurosurgeon for Margaree Mackintosh, MD.,have documented all relevant documentation on the behalf of Margaree Mackintosh, MD,as directed by  Margaree Mackintosh, MD while in the presence of Margaree Mackintosh, MD.   I, Margaree Mackintosh, MD, have reviewed all documentation for this visit. The documentation on 01/14/23 for the exam, diagnosis, procedures, and orders are all accurate and complete.

## 2022-12-26 LAB — CBC WITH DIFFERENTIAL/PLATELET
Absolute Lymphocytes: 2202 {cells}/uL (ref 850–3900)
Absolute Monocytes: 467 {cells}/uL (ref 200–950)
Basophils Absolute: 58 {cells}/uL (ref 0–200)
Basophils Relative: 0.9 %
Eosinophils Absolute: 51 {cells}/uL (ref 15–500)
Eosinophils Relative: 0.8 %
HCT: 42.1 % (ref 35.0–45.0)
Hemoglobin: 13.4 g/dL (ref 11.7–15.5)
MCH: 28.8 pg (ref 27.0–33.0)
MCHC: 31.8 g/dL — ABNORMAL LOW (ref 32.0–36.0)
MCV: 90.3 fL (ref 80.0–100.0)
MPV: 10.3 fL (ref 7.5–12.5)
Monocytes Relative: 7.3 %
Neutro Abs: 3622 {cells}/uL (ref 1500–7800)
Neutrophils Relative %: 56.6 %
Platelets: 268 10*3/uL (ref 140–400)
RBC: 4.66 10*6/uL (ref 3.80–5.10)
RDW: 12.5 % (ref 11.0–15.0)
Total Lymphocyte: 34.4 %
WBC: 6.4 10*3/uL (ref 3.8–10.8)

## 2022-12-26 LAB — COMPLETE METABOLIC PANEL WITH GFR
AG Ratio: 1.6 (calc) (ref 1.0–2.5)
ALT: 14 U/L (ref 6–29)
AST: 16 U/L (ref 10–35)
Albumin: 4.4 g/dL (ref 3.6–5.1)
Alkaline phosphatase (APISO): 54 U/L (ref 31–125)
BUN: 13 mg/dL (ref 7–25)
CO2: 25 mmol/L (ref 20–32)
Calcium: 9.2 mg/dL (ref 8.6–10.2)
Chloride: 104 mmol/L (ref 98–110)
Creat: 0.83 mg/dL (ref 0.50–0.99)
Globulin: 2.7 g/dL (ref 1.9–3.7)
Glucose, Bld: 96 mg/dL (ref 65–99)
Potassium: 4.9 mmol/L (ref 3.5–5.3)
Sodium: 139 mmol/L (ref 135–146)
Total Bilirubin: 0.5 mg/dL (ref 0.2–1.2)
Total Protein: 7.1 g/dL (ref 6.1–8.1)
eGFR: 89 mL/min/{1.73_m2} (ref >=60)

## 2022-12-26 LAB — TSH: TSH: 1.62 m[IU]/L

## 2022-12-26 LAB — LIPID PANEL
Cholesterol: 187 mg/dL (ref <200)
HDL: 62 mg/dL (ref >=50)
LDL Cholesterol (Calc): 100 mg/dL — ABNORMAL HIGH
Non-HDL Cholesterol (Calc): 125 mg/dL (ref <130)
Total CHOL/HDL Ratio: 3 (calc) (ref <5.0)
Triglycerides: 157 mg/dL — ABNORMAL HIGH (ref <150)

## 2022-12-26 LAB — VITAMIN D 25 HYDROXY (VIT D DEFICIENCY, FRACTURES): Vit D, 25-Hydroxy: 27 ng/mL — ABNORMAL LOW (ref 30–100)

## 2022-12-29 DIAGNOSIS — Z1231 Encounter for screening mammogram for malignant neoplasm of breast: Secondary | ICD-10-CM | POA: Diagnosis not present

## 2022-12-29 LAB — HM MAMMOGRAPHY

## 2022-12-30 DIAGNOSIS — F4323 Adjustment disorder with mixed anxiety and depressed mood: Secondary | ICD-10-CM | POA: Diagnosis not present

## 2023-01-01 ENCOUNTER — Ambulatory Visit: Payer: BC Managed Care – PPO | Admitting: Internal Medicine

## 2023-01-01 ENCOUNTER — Encounter: Payer: Self-pay | Admitting: Internal Medicine

## 2023-01-01 VITALS — BP 110/80 | HR 84 | Ht 62.0 in | Wt 178.0 lb

## 2023-01-01 DIAGNOSIS — Z1211 Encounter for screening for malignant neoplasm of colon: Secondary | ICD-10-CM | POA: Diagnosis not present

## 2023-01-01 DIAGNOSIS — Z9889 Other specified postprocedural states: Secondary | ICD-10-CM

## 2023-01-01 DIAGNOSIS — E559 Vitamin D deficiency, unspecified: Secondary | ICD-10-CM | POA: Diagnosis not present

## 2023-01-01 DIAGNOSIS — F439 Reaction to severe stress, unspecified: Secondary | ICD-10-CM

## 2023-01-01 DIAGNOSIS — Z6832 Body mass index (BMI) 32.0-32.9, adult: Secondary | ICD-10-CM | POA: Diagnosis not present

## 2023-01-01 DIAGNOSIS — Z803 Family history of malignant neoplasm of breast: Secondary | ICD-10-CM

## 2023-01-01 DIAGNOSIS — Z0000 Encounter for general adult medical examination without abnormal findings: Secondary | ICD-10-CM

## 2023-01-01 LAB — POCT URINALYSIS DIP (CLINITEK)
Bilirubin, UA: NEGATIVE
Blood, UA: NEGATIVE
Glucose, UA: NEGATIVE mg/dL
Ketones, POC UA: NEGATIVE mg/dL
Leukocytes, UA: NEGATIVE
Nitrite, UA: NEGATIVE
POC PROTEIN,UA: NEGATIVE
Spec Grav, UA: 1.015 (ref 1.010–1.025)
Urobilinogen, UA: 0.2 U/dL
pH, UA: 6 (ref 5.0–8.0)

## 2023-01-01 MED ORDER — ERGOCALCIFEROL 1.25 MG (50000 UT) PO CAPS: 50000.0000 [IU] | ORAL_CAPSULE | ORAL | 3 refills | Status: DC

## 2023-01-06 DIAGNOSIS — F4323 Adjustment disorder with mixed anxiety and depressed mood: Secondary | ICD-10-CM | POA: Diagnosis not present

## 2023-01-14 ENCOUNTER — Encounter: Payer: Self-pay | Admitting: Internal Medicine

## 2023-01-14 NOTE — Patient Instructions (Addendum)
We are sorry you are having a hard time.  Situational stress discussed regarding her son.  Suggest weight loss management clinic to help with weight loss.  Continue vitamin D supplement.  Return in 1 year or as needed.  Continue OB/GYN care on an annual basis.  Mammogram was normal in December 10: 2024.  Referred to  GI for colonoscopy.

## 2023-03-02 ENCOUNTER — Encounter: Payer: Self-pay | Admitting: Gastroenterology

## 2023-03-17 DIAGNOSIS — Z6372 Alcoholism and drug addiction in family: Secondary | ICD-10-CM | POA: Diagnosis not present

## 2023-03-17 DIAGNOSIS — Z630 Problems in relationship with spouse or partner: Secondary | ICD-10-CM | POA: Diagnosis not present

## 2023-03-17 DIAGNOSIS — F419 Anxiety disorder, unspecified: Secondary | ICD-10-CM | POA: Diagnosis not present

## 2023-03-17 DIAGNOSIS — Z6379 Other stressful life events affecting family and household: Secondary | ICD-10-CM | POA: Diagnosis not present

## 2023-03-24 DIAGNOSIS — Z6379 Other stressful life events affecting family and household: Secondary | ICD-10-CM | POA: Diagnosis not present

## 2023-03-24 DIAGNOSIS — Z630 Problems in relationship with spouse or partner: Secondary | ICD-10-CM | POA: Diagnosis not present

## 2023-03-24 DIAGNOSIS — Z6372 Alcoholism and drug addiction in family: Secondary | ICD-10-CM | POA: Diagnosis not present

## 2023-03-24 DIAGNOSIS — F419 Anxiety disorder, unspecified: Secondary | ICD-10-CM | POA: Diagnosis not present

## 2023-03-26 ENCOUNTER — Ambulatory Visit: Payer: BC Managed Care – PPO

## 2023-03-26 ENCOUNTER — Other Ambulatory Visit: Payer: Self-pay

## 2023-03-26 VITALS — Ht 62.0 in | Wt 180.0 lb

## 2023-03-26 DIAGNOSIS — Z1211 Encounter for screening for malignant neoplasm of colon: Secondary | ICD-10-CM

## 2023-03-26 MED ORDER — SUFLAVE 178.7 G PO SOLR: 1.0000 | ORAL | 0 refills | Status: DC

## 2023-03-26 NOTE — Progress Notes (Signed)
 Denies allergies to eggs or soy products. Denies complication of anesthesia or sedation. Denies use of weight loss medication. Denies use of O2.   Emmi instructions given for colonoscopy.

## 2023-04-07 DIAGNOSIS — F419 Anxiety disorder, unspecified: Secondary | ICD-10-CM | POA: Diagnosis not present

## 2023-04-07 DIAGNOSIS — Z6379 Other stressful life events affecting family and household: Secondary | ICD-10-CM | POA: Diagnosis not present

## 2023-04-07 DIAGNOSIS — Z630 Problems in relationship with spouse or partner: Secondary | ICD-10-CM | POA: Diagnosis not present

## 2023-04-07 DIAGNOSIS — Z6372 Alcoholism and drug addiction in family: Secondary | ICD-10-CM | POA: Diagnosis not present

## 2023-04-11 ENCOUNTER — Encounter: Payer: Self-pay | Admitting: Certified Registered Nurse Anesthetist

## 2023-04-13 ENCOUNTER — Encounter: Payer: Self-pay | Admitting: Gastroenterology

## 2023-04-14 DIAGNOSIS — Z6372 Alcoholism and drug addiction in family: Secondary | ICD-10-CM | POA: Diagnosis not present

## 2023-04-14 DIAGNOSIS — F419 Anxiety disorder, unspecified: Secondary | ICD-10-CM | POA: Diagnosis not present

## 2023-04-14 DIAGNOSIS — Z630 Problems in relationship with spouse or partner: Secondary | ICD-10-CM | POA: Diagnosis not present

## 2023-04-14 DIAGNOSIS — Z6379 Other stressful life events affecting family and household: Secondary | ICD-10-CM | POA: Diagnosis not present

## 2023-04-15 ENCOUNTER — Encounter: Payer: Self-pay | Admitting: Gastroenterology

## 2023-04-15 ENCOUNTER — Ambulatory Visit: Payer: BC Managed Care – PPO | Admitting: Gastroenterology

## 2023-04-15 VITALS — BP 128/86 | HR 59 | Temp 98.2°F | Resp 13 | Ht 62.0 in | Wt 180.0 lb

## 2023-04-15 DIAGNOSIS — K644 Residual hemorrhoidal skin tags: Secondary | ICD-10-CM

## 2023-04-15 DIAGNOSIS — Z1211 Encounter for screening for malignant neoplasm of colon: Secondary | ICD-10-CM | POA: Diagnosis not present

## 2023-04-15 DIAGNOSIS — D123 Benign neoplasm of transverse colon: Secondary | ICD-10-CM | POA: Diagnosis not present

## 2023-04-15 DIAGNOSIS — K648 Other hemorrhoids: Secondary | ICD-10-CM

## 2023-04-15 DIAGNOSIS — K5730 Diverticulosis of large intestine without perforation or abscess without bleeding: Secondary | ICD-10-CM | POA: Diagnosis not present

## 2023-04-15 MED ORDER — SODIUM CHLORIDE 0.9 % IV SOLN
500.0000 mL | Freq: Once | INTRAVENOUS | Status: DC
Start: 2023-04-15 — End: 2023-04-15

## 2023-04-15 NOTE — Progress Notes (Signed)
 [facility] History and Physical   Primary Care Physician:  Margaree Mackintosh, MD   Reason for Procedure:  Colorectal cancer screening  Plan:    Screening colonoscopy with possible interventions as needed     HPI: Brooke Bryan is a very pleasant 48 y.o. female here for screening colonoscopy. Denies any nausea, vomiting, abdominal pain, melena or bright red blood per rectum  The risks and benefits as well as alternatives of endoscopic procedure(s) have been discussed and reviewed. All questions answered. The patient agrees to proceed.    Past Medical History:  Diagnosis Date   Anxiety    Fatigue    Herpes simplex virus type 1 (HSV-1) dermatitis    History of abnormal Pap smear    Pre-diabetes    Vitamin D deficiency     Past Surgical History:  Procedure Laterality Date   BREAST REDUCTION SURGERY     LEEP  01/20/2000   Dr. Jenetta Downer    Prior to Admission medications   Medication Sig Start Date End Date Taking? Authorizing Provider  ergocalciferol (DRISDOL) 1.25 MG (50000 UT) capsule Take 1 capsule (50,000 Units total) by mouth once a week. 01/01/23  Yes BaxleyLuanna Cole, MD  Multiple Vitamin (MULTIVITAMIN) tablet Take 1 tablet by mouth daily.   Yes [provider]  ondansetron (ZOFRAN) 4 MG tablet TAKE 1 TABLET BY MOUTH EVERY 8 HOURS AS NEEDED FOR NAUSEA AND VOMITING 04/05/22  Yes Baxley, Luanna Cole, MD  OVER THE COUNTER MEDICATION Biotin one tablespoon daily.    [provider]  valACYclovir (VALTREX) 1000 MG tablet One tab 3 times daily for 7 days for Herpes zosterOne tab 3 times daily for 7 days for Herpes zoster 06/04/22   Margaree Mackintosh, MD    Current Outpatient Medications  Medication Sig Dispense Refill   ergocalciferol (DRISDOL) 1.25 MG (50000 UT) capsule Take 1 capsule (50,000 Units total) by mouth once a week. 12 capsule 3   Multiple Vitamin (MULTIVITAMIN) tablet Take 1 tablet by mouth daily.     ondansetron (ZOFRAN) 4 MG tablet TAKE 1  TABLET BY MOUTH EVERY 8 HOURS AS NEEDED FOR NAUSEA AND VOMITING 18 tablet 1   OVER THE COUNTER MEDICATION Biotin one tablespoon daily.     valACYclovir (VALTREX) 1000 MG tablet One tab 3 times daily for 7 days for Herpes zosterOne tab 3 times daily for 7 days for Herpes zoster 21 tablet 0   Current Facility-Administered Medications  Medication Dose Route Frequency Provider Last Rate Last Admin   0.9 %  sodium chloride infusion  500 mL Intravenous Once Napoleon Form, MD        Allergies as of 04/15/2023   (No Known Allergies)    Family History  Problem Relation Age of Onset   Thyroid disease Mother    Cancer Mother    Alcoholism Mother    Hypertension Father    Hyperlipidemia Father    AAA (abdominal aortic aneurysm) Father    Cancer Maternal Grandmother    Colon cancer Neg Hx    Esophageal cancer Neg Hx    Rectal cancer Neg Hx    Stomach cancer Neg Hx     Social History   Socioeconomic History   Marital status: Married    Spouse name: Trishelle Devora   Number of children: Not on file   Years of education: Not on file   Highest education level: Not on file  Occupational History   Occupation: Water engineer  Tobacco Use   Smoking status: Never   Smokeless tobacco: Never  Vaping Use   Vaping status: Never Used  Substance and Sexual Activity   Alcohol use: Yes    Comment: rarely   Drug use: No   Sexual activity: Not on file  Other Topics Concern   Not on file  Social History Narrative   Not on file   Social Drivers of Health   Financial Resource Strain: Not on file  Food Insecurity: No Food Insecurity (01/01/2023)   Hunger Vital Sign    Worried About Running Out of Food in the Last Year: Never true    Ran Out of Food in the Last Year: Never true  Transportation Needs: No Transportation Needs (01/01/2023)   PRAPARE - Administrator, Civil Service (Medical): No    Lack of Transportation (Non-Medical): No  Physical Activity: Not on file   Stress: Not on file  Social Connections: Not on file  Intimate Partner Violence: Not At Risk (01/01/2023)   Humiliation, Afraid, Rape, and Kick questionnaire    Fear of Current or Ex-Partner: No    Emotionally Abused: No    Physically Abused: No    Sexually Abused: No    Review of Systems:  All other review of systems negative except as mentioned in the HPI.  Physical Exam: Vital signs in last 24 hours: BP (!) 139/97   Pulse 76   Temp 98.2 F (36.8 C) (Skin)   Ht 5\' 2"  (1.575 m)   Wt 180 lb (81.6 kg)   SpO2 98%   BMI 32.92 kg/m  General:   Alert, NAD Lungs:  Clear .   Heart:  Regular rate and rhythm Abdomen:  Soft, nontender and nondistended. Neuro/Psych:  Alert and cooperative. Normal mood and affect. A and O x 3  Reviewed labs, radiology imaging, old records and pertinent past GI work up  Patient is appropriate for planned procedure(s) and anesthesia in an ambulatory setting   K. Scherry Ran , MD [phone]

## 2023-04-15 NOTE — Progress Notes (Signed)
 Called to room to assist during endoscopic procedure.  Patient ID and intended procedure confirmed with present staff. Received instructions for my participation in the procedure from the performing physician.

## 2023-04-15 NOTE — Patient Instructions (Signed)
-  Handout on polyps provided -await pathology results -repeat colonoscopy for surveillance recommended in 5-10 years -Continue present medications    YOU HAD AN ENDOSCOPIC PROCEDURE TODAY AT THE  ENDOSCOPY CENTER:   Refer to the procedure report that was given to you for any specific questions about what was found during the examination.  If the procedure report does not answer your questions, please call your gastroenterologist to clarify.  If you requested that your care partner not be given the details of your procedure findings, then the procedure report has been included in a sealed envelope for you to review at your convenience later.  YOU SHOULD EXPECT: Some feelings of bloating in the abdomen. Passage of more gas than usual.  Walking can help get rid of the air that was put into your GI tract during the procedure and reduce the bloating. If you had a lower endoscopy (such as a colonoscopy or flexible sigmoidoscopy) you may notice spotting of blood in your stool or on the toilet paper. If you underwent a bowel prep for your procedure, you may not have a normal bowel movement for a few days.  Please Note:  You might notice some irritation and congestion in your nose or some drainage.  This is from the oxygen used during your procedure.  There is no need for concern and it should clear up in a day or so.  SYMPTOMS TO REPORT IMMEDIATELY:  Following lower endoscopy (colonoscopy or flexible sigmoidoscopy):  Excessive amounts of blood in the stool  Significant tenderness or worsening of abdominal pains  Swelling of the abdomen that is new, acute  Fever of 100F or higher  For urgent or emergent issues, a gastroenterologist can be reached at any hour by calling [phone]. Do not use MyChart messaging for urgent concerns.    DIET:  We do recommend a small meal at first, but then you may proceed to your regular diet.  Drink plenty of fluids but you should avoid alcoholic beverages for  24 hours.  ACTIVITY:  You should plan to take it easy for the rest of today and you should NOT DRIVE or use heavy machinery until tomorrow (because of the sedation medicines used during the test).    FOLLOW UP: Our staff will call the number listed on your records the next business day following your procedure.  We will call around 7:15- 8:00 am to check on you and address any questions or concerns that you may have regarding the information given to you following your procedure. If we do not reach you, we will leave a message.     If any biopsies were taken you will be contacted by phone or by letter within the next 1-3 weeks.  Please call us at [phone] if you have not heard about the biopsies in 3 weeks.    SIGNATURES/CONFIDENTIALITY: You and/or your care partner have signed paperwork which will be entered into your electronic medical record.  These signatures attest to the fact that that the information above on your After Visit Summary has been reviewed and is understood.  Full responsibility of the confidentiality of this discharge information lies with you and/or your care-partner.

## 2023-04-15 NOTE — Op Note (Signed)
 [facility] Patient Name: Brooke Bryan Procedure Date: 04/15/2023 11:37 AM MRN: [id number] Endoscopist: Napoleon Form , MD, [id number] Age: 48 Referring MD:  Date of Birth: 02-Jan-1976 Gender: Female Account #: [id number] Procedure:                Colonoscopy Indications:              Screening for colorectal malignant neoplasm Medicines:                Monitored Anesthesia Care Procedure:                Pre-Anesthesia Assessment:                           - Prior to the procedure, a History and Physical                            was performed, and patient medications and                            allergies were reviewed. The patient's tolerance of                            previous anesthesia was also reviewed. The risks                            and benefits of the procedure and the sedation                            options and risks were discussed with the patient.                            All questions were answered, and informed consent                            was obtained. Prior Anticoagulants: The patient has                            taken no anticoagulant or antiplatelet agents. ASA                            Grade Assessment: I - A normal, healthy patient.                            After reviewing the risks and benefits, the patient                            was deemed in satisfactory condition to undergo the                            procedure.                           After obtaining informed consent, the colonoscope  was passed under direct vision. Throughout the                            procedure, the patient's blood pressure, pulse, and                            oxygen saturations were monitored continuously. The                            Olympus Scope SN [id number] was introduced through the                            anus and advanced to the the cecum, identified by                            appendiceal  orifice and ileocecal valve. The                            colonoscopy was performed without difficulty. The                            patient tolerated the procedure well. The quality                            of the bowel preparation was good. The ileocecal                            valve, appendiceal orifice, and rectum were                            photographed. Scope In: 11:41:04 AM Scope Out: 11:54:57 AM Scope Withdrawal Time: 0 hours 11 minutes 31 seconds  Total Procedure Duration: 0 hours 13 minutes 53 seconds  Findings:                 The perianal and digital rectal examinations were                            normal.                           A 4 mm polyp was found in the transverse colon. The                            polyp was sessile. The polyp was removed with a                            cold snare. Resection and retrieval were complete.                           A few small-mouthed diverticula were found in the                            sigmoid colon.  Non-bleeding external and internal hemorrhoids were                            found during retroflexion. The hemorrhoids were                            small. Complications:            No immediate complications. Estimated Blood Loss:     Estimated blood loss was minimal. Impression:               - One 4 mm polyp in the transverse colon, removed                            with a cold snare. Resected and retrieved.                           - Diverticulosis in the sigmoid colon.                           - Non-bleeding external and internal hemorrhoids. Recommendation:           - Patient has a contact number available for                            emergencies. The signs and symptoms of potential                            delayed complications were discussed with the                            patient. Return to normal activities tomorrow.                            Written discharge  instructions were provided to the                            patient.                           - Resume previous diet.                           - Continue present medications.                           - Await pathology results.                           - Repeat colonoscopy in 5-10 years for surveillance                            based on pathology results. Napoleon Form, MD 04/15/2023 12:00:42 PM This report has been signed electronically.

## 2023-04-15 NOTE — Progress Notes (Signed)
 1155 Report given to PACU, vss

## 2023-04-15 NOTE — Progress Notes (Signed)
 Pt's states no medical or surgical changes since previsit or office visit.

## 2023-04-16 ENCOUNTER — Telehealth: Payer: Self-pay

## 2023-04-16 NOTE — Telephone Encounter (Signed)
  Follow up Call-     04/15/2023   11:03 AM  Call back number  Post procedure Call Back phone  # [phone]  Permission to leave phone message Yes     Patient questions:  Do you have a fever, pain , or abdominal swelling? No. Pain Score  0 *  Have you tolerated food without any problems? Yes.    Have you been able to return to your normal activities? Yes.    Do you have any questions about your discharge instructions: Diet   No. Medications  No. Follow up visit  No.  Do you have questions or concerns about your Care? No.  Actions: * If pain score is 4 or above: No action needed, pain <4.

## 2023-04-19 DIAGNOSIS — Z630 Problems in relationship with spouse or partner: Secondary | ICD-10-CM | POA: Diagnosis not present

## 2023-04-19 DIAGNOSIS — F419 Anxiety disorder, unspecified: Secondary | ICD-10-CM | POA: Diagnosis not present

## 2023-04-19 DIAGNOSIS — Z6372 Alcoholism and drug addiction in family: Secondary | ICD-10-CM | POA: Diagnosis not present

## 2023-04-19 DIAGNOSIS — Z6379 Other stressful life events affecting family and household: Secondary | ICD-10-CM | POA: Diagnosis not present

## 2023-04-20 LAB — SURGICAL PATHOLOGY

## 2023-04-28 DIAGNOSIS — F419 Anxiety disorder, unspecified: Secondary | ICD-10-CM | POA: Diagnosis not present

## 2023-04-28 DIAGNOSIS — Z630 Problems in relationship with spouse or partner: Secondary | ICD-10-CM | POA: Diagnosis not present

## 2023-04-28 DIAGNOSIS — Z6372 Alcoholism and drug addiction in family: Secondary | ICD-10-CM | POA: Diagnosis not present

## 2023-04-28 DIAGNOSIS — Z6379 Other stressful life events affecting family and household: Secondary | ICD-10-CM | POA: Diagnosis not present

## 2023-05-03 ENCOUNTER — Ambulatory Visit: Payer: Self-pay

## 2023-05-03 NOTE — Telephone Encounter (Signed)
 Chief Complaint: Mono exposure  Symptoms: Left lower abdomen pain, clear runny nose Frequency: Intermittent Pertinent Negatives: Patient denies sore throat, fever, pain radiation, diarrhea, pain with urination, nausea  Disposition:  [x] Appointment(In office)  Additional Notes: Pt states her daughter has been home for two weeks due to mono. Pt is interested in getting a blood test to see if she has mono. Pt reports intermittent left lower abdomen pain that started today (3-4/10). Pt scheduled for an appointment tomorrow. This RN educated pt on new-worsening symptoms and when to call back/seek emergent care. Pt verbalized understanding and agrees to plan.   Reason for Disposition  [1] MODERATE pain (e.g., interferes with normal activities) AND [2] pain comes and goes (cramps) AND [3] present > 24 hours  (Exception: Pain with Vomiting or Diarrhea - see that Guideline.)  Answer Assessment - Initial Assessment Questions Intermittent left lower lower abndomen pain started today 3-4/10 pain level  Chief Complaint: Mono exposure  Symptoms: Left lower abdomen pain, clear runny nose Frequency: Intermittent Pertinent Negatives: Patient denies sore throat, fever, pain radiation, diarrhea, pain with urination, nausea  Protocols used: Abdominal Pain - Female-A-AH

## 2023-05-03 NOTE — Progress Notes (Incomplete)
   Patient Care Team: Brooke Evener, MD as PCP - General (Internal Medicine)  Visit Date: 05/03/23  Subjective:  No chief complaint on file.  BP Readings from Last 1 Encounters:  04/15/23 128/86   Patient ZO:[id number] Brooke Bryan,Female DOB:May 03, 1975,48 y.o. Brooke Bryan   48 y.o. Female presents today for acute sick visit with LLQ Pain. Patient has a past medical history of ***.      Past Medical History:  Diagnosis Date   Anxiety    Fatigue    Herpes simplex virus type 1 (HSV-1) dermatitis    History of abnormal Pap smear    Pre-diabetes    Vitamin D deficiency     No Known Allergies  Family History  Problem Relation Age of Onset   Thyroid disease Mother    Cancer Mother    Alcoholism Mother    Hypertension Father    Hyperlipidemia Father    AAA (abdominal aortic aneurysm) Father    Cancer Maternal Grandmother    Colon cancer Neg Hx    Esophageal cancer Neg Hx    Rectal cancer Neg Hx    Stomach cancer Neg Hx    Social History   Social History Narrative   Not on file   ROS   Objective:  Vitals: There were no vitals taken for this visit.  Physical Exam  Results:  Studies Obtained And Personally Reviewed By Me:  Imaging, colonoscopy, mammogram, bone density scan, echocardiogram, heart cath, stress test, CT calcium score, etc. ***  Labs:     Component Value Date/Time   NA 139 12/25/2022 0909   K 4.9 12/25/2022 0909   CL 104 12/25/2022 0909   CO2 25 12/25/2022 0909   GLUCOSE 96 12/25/2022 0909   BUN 13 12/25/2022 0909   CREATININE 0.83 12/25/2022 0909   CALCIUM 9.2 12/25/2022 0909   PROT 7.1 12/25/2022 0909   ALBUMIN 4.5 07/12/2014 0941   AST 16 12/25/2022 0909   ALT 14 12/25/2022 0909   ALKPHOS 56 07/12/2014 0941   BILITOT 0.5 12/25/2022 0909   GFRNONAA 92 02/24/2019 0938   GFRAA 106 02/24/2019 0938    Lab Results  Component Value Date   WBC 6.4 12/25/2022   HGB 13.4 12/25/2022   HCT 42.1 12/25/2022   MCV 90.3 12/25/2022   PLT 268  12/25/2022   Lab Results  Component Value Date   CHOL 187 12/25/2022   HDL 62 12/25/2022   LDLCALC 100 (H) 12/25/2022   TRIG 157 (H) 12/25/2022   CHOLHDL 3.0 12/25/2022   Lab Results  Component Value Date   HGBA1C 5.3 12/10/2020    Lab Results  Component Value Date   TSH 1.62 12/25/2022    {PSA (Optional):32132} No results found for any visits on 05/04/23. Assessment & Plan:   ***    I,Brooke Bryan,acting as a scribe for Brooke Evener, MD.,have documented all relevant documentation on the behalf of Brooke Evener, MD,as directed by  Brooke Evener, MD while in the presence of Brooke Evener, MD.   ***

## 2023-05-03 NOTE — Telephone Encounter (Signed)
 This RN made the first attempt to contact the patient. Patient did not answer, RN LVM with a callback number.    Copied from CRM [id number]. Topic: Clinical - Medical Advice >> May 03, 2023  9:43 AM Brooke Bryan wrote: Reason for CRM: Patient wants to be sure she hasn't contracted mono from her daughter patient is asking if theres a blood test that can be done to check patient is requesting call back

## 2023-05-03 NOTE — Telephone Encounter (Signed)
Patient is scheduled tomorrow at 10:30.

## 2023-05-04 ENCOUNTER — Ambulatory Visit: Admitting: Internal Medicine

## 2023-05-05 DIAGNOSIS — Z6379 Other stressful life events affecting family and household: Secondary | ICD-10-CM | POA: Diagnosis not present

## 2023-05-05 DIAGNOSIS — Z6372 Alcoholism and drug addiction in family: Secondary | ICD-10-CM | POA: Diagnosis not present

## 2023-05-05 DIAGNOSIS — F419 Anxiety disorder, unspecified: Secondary | ICD-10-CM | POA: Diagnosis not present

## 2023-05-05 DIAGNOSIS — Z630 Problems in relationship with spouse or partner: Secondary | ICD-10-CM | POA: Diagnosis not present

## 2023-05-17 DIAGNOSIS — F419 Anxiety disorder, unspecified: Secondary | ICD-10-CM | POA: Diagnosis not present

## 2023-05-17 DIAGNOSIS — Z630 Problems in relationship with spouse or partner: Secondary | ICD-10-CM | POA: Diagnosis not present

## 2023-05-17 DIAGNOSIS — Z6379 Other stressful life events affecting family and household: Secondary | ICD-10-CM | POA: Diagnosis not present

## 2023-05-17 DIAGNOSIS — Z6372 Alcoholism and drug addiction in family: Secondary | ICD-10-CM | POA: Diagnosis not present

## 2023-05-26 DIAGNOSIS — Z6379 Other stressful life events affecting family and household: Secondary | ICD-10-CM | POA: Diagnosis not present

## 2023-05-26 DIAGNOSIS — Z6372 Alcoholism and drug addiction in family: Secondary | ICD-10-CM | POA: Diagnosis not present

## 2023-05-26 DIAGNOSIS — F419 Anxiety disorder, unspecified: Secondary | ICD-10-CM | POA: Diagnosis not present

## 2023-05-26 DIAGNOSIS — Z630 Problems in relationship with spouse or partner: Secondary | ICD-10-CM | POA: Diagnosis not present

## 2023-06-02 DIAGNOSIS — F419 Anxiety disorder, unspecified: Secondary | ICD-10-CM | POA: Diagnosis not present

## 2023-06-02 DIAGNOSIS — Z6379 Other stressful life events affecting family and household: Secondary | ICD-10-CM | POA: Diagnosis not present

## 2023-06-02 DIAGNOSIS — Z6372 Alcoholism and drug addiction in family: Secondary | ICD-10-CM | POA: Diagnosis not present

## 2023-06-02 DIAGNOSIS — Z630 Problems in relationship with spouse or partner: Secondary | ICD-10-CM | POA: Diagnosis not present

## 2023-06-03 ENCOUNTER — Ambulatory Visit: Payer: Self-pay | Admitting: Gastroenterology

## 2023-06-09 DIAGNOSIS — Z6379 Other stressful life events affecting family and household: Secondary | ICD-10-CM | POA: Diagnosis not present

## 2023-06-09 DIAGNOSIS — Z6372 Alcoholism and drug addiction in family: Secondary | ICD-10-CM | POA: Diagnosis not present

## 2023-06-09 DIAGNOSIS — F419 Anxiety disorder, unspecified: Secondary | ICD-10-CM | POA: Diagnosis not present

## 2023-06-09 DIAGNOSIS — Z630 Problems in relationship with spouse or partner: Secondary | ICD-10-CM | POA: Diagnosis not present

## 2023-06-16 DIAGNOSIS — F419 Anxiety disorder, unspecified: Secondary | ICD-10-CM | POA: Diagnosis not present

## 2023-06-16 DIAGNOSIS — Z6372 Alcoholism and drug addiction in family: Secondary | ICD-10-CM | POA: Diagnosis not present

## 2023-06-16 DIAGNOSIS — Z630 Problems in relationship with spouse or partner: Secondary | ICD-10-CM | POA: Diagnosis not present

## 2023-06-16 DIAGNOSIS — Z6379 Other stressful life events affecting family and household: Secondary | ICD-10-CM | POA: Diagnosis not present

## 2023-06-23 DIAGNOSIS — Z6372 Alcoholism and drug addiction in family: Secondary | ICD-10-CM | POA: Diagnosis not present

## 2023-06-23 DIAGNOSIS — Z6379 Other stressful life events affecting family and household: Secondary | ICD-10-CM | POA: Diagnosis not present

## 2023-06-23 DIAGNOSIS — Z630 Problems in relationship with spouse or partner: Secondary | ICD-10-CM | POA: Diagnosis not present

## 2023-06-23 DIAGNOSIS — F419 Anxiety disorder, unspecified: Secondary | ICD-10-CM | POA: Diagnosis not present

## 2023-06-29 DIAGNOSIS — Z6372 Alcoholism and drug addiction in family: Secondary | ICD-10-CM | POA: Diagnosis not present

## 2023-06-29 DIAGNOSIS — F419 Anxiety disorder, unspecified: Secondary | ICD-10-CM | POA: Diagnosis not present

## 2023-06-29 DIAGNOSIS — Z6379 Other stressful life events affecting family and household: Secondary | ICD-10-CM | POA: Diagnosis not present

## 2023-06-29 DIAGNOSIS — Z630 Problems in relationship with spouse or partner: Secondary | ICD-10-CM | POA: Diagnosis not present

## 2023-07-07 DIAGNOSIS — F419 Anxiety disorder, unspecified: Secondary | ICD-10-CM | POA: Diagnosis not present

## 2023-07-07 DIAGNOSIS — Z630 Problems in relationship with spouse or partner: Secondary | ICD-10-CM | POA: Diagnosis not present

## 2023-07-07 DIAGNOSIS — Z6372 Alcoholism and drug addiction in family: Secondary | ICD-10-CM | POA: Diagnosis not present

## 2023-07-07 DIAGNOSIS — Z6379 Other stressful life events affecting family and household: Secondary | ICD-10-CM | POA: Diagnosis not present

## 2023-07-14 DIAGNOSIS — Z630 Problems in relationship with spouse or partner: Secondary | ICD-10-CM | POA: Diagnosis not present

## 2023-07-14 DIAGNOSIS — Z6372 Alcoholism and drug addiction in family: Secondary | ICD-10-CM | POA: Diagnosis not present

## 2023-07-14 DIAGNOSIS — F419 Anxiety disorder, unspecified: Secondary | ICD-10-CM | POA: Diagnosis not present

## 2023-07-14 DIAGNOSIS — Z6379 Other stressful life events affecting family and household: Secondary | ICD-10-CM | POA: Diagnosis not present

## 2023-07-28 DIAGNOSIS — Z6372 Alcoholism and drug addiction in family: Secondary | ICD-10-CM | POA: Diagnosis not present

## 2023-07-28 DIAGNOSIS — Z6379 Other stressful life events affecting family and household: Secondary | ICD-10-CM | POA: Diagnosis not present

## 2023-07-28 DIAGNOSIS — Z630 Problems in relationship with spouse or partner: Secondary | ICD-10-CM | POA: Diagnosis not present

## 2023-07-28 DIAGNOSIS — F419 Anxiety disorder, unspecified: Secondary | ICD-10-CM | POA: Diagnosis not present

## 2023-08-11 DIAGNOSIS — Z6372 Alcoholism and drug addiction in family: Secondary | ICD-10-CM | POA: Diagnosis not present

## 2023-08-11 DIAGNOSIS — Z630 Problems in relationship with spouse or partner: Secondary | ICD-10-CM | POA: Diagnosis not present

## 2023-08-11 DIAGNOSIS — F419 Anxiety disorder, unspecified: Secondary | ICD-10-CM | POA: Diagnosis not present

## 2023-08-11 DIAGNOSIS — Z6379 Other stressful life events affecting family and household: Secondary | ICD-10-CM | POA: Diagnosis not present

## 2023-08-18 DIAGNOSIS — Z6372 Alcoholism and drug addiction in family: Secondary | ICD-10-CM | POA: Diagnosis not present

## 2023-08-18 DIAGNOSIS — Z6379 Other stressful life events affecting family and household: Secondary | ICD-10-CM | POA: Diagnosis not present

## 2023-08-18 DIAGNOSIS — Z630 Problems in relationship with spouse or partner: Secondary | ICD-10-CM | POA: Diagnosis not present

## 2023-08-18 DIAGNOSIS — F419 Anxiety disorder, unspecified: Secondary | ICD-10-CM | POA: Diagnosis not present

## 2023-08-25 DIAGNOSIS — Z630 Problems in relationship with spouse or partner: Secondary | ICD-10-CM | POA: Diagnosis not present

## 2023-08-25 DIAGNOSIS — Z6379 Other stressful life events affecting family and household: Secondary | ICD-10-CM | POA: Diagnosis not present

## 2023-08-25 DIAGNOSIS — Z6372 Alcoholism and drug addiction in family: Secondary | ICD-10-CM | POA: Diagnosis not present

## 2023-08-25 DIAGNOSIS — F419 Anxiety disorder, unspecified: Secondary | ICD-10-CM | POA: Diagnosis not present

## 2023-08-26 NOTE — Progress Notes (Signed)
 Patient Care Team: Perri Ronal PARAS, MD as PCP - General (Internal Medicine)  Visit Date: 08/27/23  Subjective:   Chief Complaint  Patient presents with   Weight Loss   Patient PI:Brooke Bryan, Brooke Bryan DOB:1975/11/16,48 y.o. FMW:[id number]   48 y.o.Female presents today for acute visit for Weight Loss Management. Patient has a past medical history of Vitamin-D Deficiency, level 27 in December 2024 managed on 50,000 units weekly; Impaired Glucose Tolerance noted 2016 w/ 2022 HgbA1c 5.7%, but since then has ranged 5.2-5.4. (BMI 30+; since 2015, when she weighed 164 lbs, she has gained 18 pounds and today weighs 182 lbs(BMI 33.29). In 12/2022 HgbA1c 96, LDL 100, Triglycerides 157, and TSH 1.62. Past Medical History:  Diagnosis Date   Anxiety    Fatigue    Herpes simplex virus type 1 (HSV-1) dermatitis    History of abnormal Pap smear    Pre-diabetes    Vitamin D  deficiency     No Known Allergies Immunization History  Administered Date(s) Administered   Influenza Inj Mdck Quad Pf 10/25/2017   Influenza,inj,Quad PF,6+ Mos 01/15/2017, 11/01/2018   Influenza-Unspecified 11/19/2020   Td 06/10/1993   Tdap 12/09/2009, 02/28/2019   Past Surgical History:  Procedure Laterality Date   BREAST REDUCTION SURGERY     LEEP  01/20/2000   Dr. LELON Nicholaus    Family History  Problem Relation Age of Onset   Thyroid disease Mother    Cancer Mother    Alcoholism Mother    Hypertension Father    Hyperlipidemia Father    AAA (abdominal aortic aneurysm) Father    Cancer Maternal Grandmother    Colon cancer Neg Hx    Esophageal cancer Neg Hx    Rectal cancer Neg Hx    Stomach cancer Neg Hx    Social Hx: Patient has a history of mild glucose intolerance 6 or 7 years ago with hemoglobin A1c at that time 5.7% but for the past several years hemoglobin A1c has been stable and was 5.3% in 2022.  We have checked hemoglobin A1c today along with TSH.  She has a history of vitamin D  deficiency.  In  December 2024 vitamin D  level was 27.  She has been prescribed high-dose vitamin D  supplement 50,000 units weekly.  Review of Systems  Constitutional:  Negative for fever and malaise/fatigue.  HENT:  Negative for congestion.   Eyes:  Negative for blurred vision.  Respiratory:  Negative for cough and shortness of breath.   Cardiovascular:  Negative for chest pain, palpitations and leg swelling.  Gastrointestinal:  Negative for vomiting.  Musculoskeletal:  Negative for back pain.  Skin:  Negative for rash.  Neurological:  Negative for loss of consciousness and headaches.     Objective:  Vitals: BP 100/80   Pulse 86   Ht 5' 2 (1.575 m)   Wt 182 lb (82.6 kg)   LMP 08/09/2023   SpO2 99%   BMI 33.29 kg/m   Physical Exam Vitals and nursing note reviewed.  Constitutional:      General: She is not in acute distress.    Appearance: Normal appearance. She is not toxic-appearing.  HENT:     Head: Normocephalic and atraumatic.  Pulmonary:     Effort: Pulmonary effort is normal.  Skin:    General: Skin is warm and dry.  Neurological:     Mental Status: She is alert and oriented to person, place, and time. Mental status is at baseline.  Psychiatric:  Mood and Affect: Mood normal.        Behavior: Behavior normal.        Thought Content: Thought content normal.        Judgment: Judgment normal.     Results:  Studies Obtained And Personally Reviewed By Me: Labs:  CBC w/ Differential Lab Results  Component Value Date   WBC 6.4 12/25/2022   RBC 4.66 12/25/2022   HGB 13.4 12/25/2022   HCT 42.1 12/25/2022   PLT 268 12/25/2022   MCV 90.3 12/25/2022   MCH 28.8 12/25/2022   MCHC 31.8 (L) 12/25/2022   RDW 12.5 12/25/2022   MPV 10.3 12/25/2022   LYMPHSABS 2,613 12/16/2021   MONOABS 0.4 07/12/2014   BASOSABS 58 12/25/2022    Comprehensive Metabolic Panel Lab Results  Component Value Date   NA 139 12/25/2022   K 4.9 12/25/2022   CL 104 12/25/2022   CO2 25 12/25/2022    GLUCOSE 96 12/25/2022   BUN 13 12/25/2022   CREATININE 0.83 12/25/2022   CALCIUM 9.2 12/25/2022   PROT 7.1 12/25/2022   ALBUMIN 4.5 07/12/2014   AST 16 12/25/2022   ALT 14 12/25/2022   ALKPHOS 56 07/12/2014   BILITOT 0.5 12/25/2022   EGFR 89 12/25/2022   GFRNONAA 92 02/24/2019   Lipid Panel  Lab Results  Component Value Date   CHOL 187 12/25/2022   HDL 62 12/25/2022   LDLCALC 100 (H) 12/25/2022   TRIG 157 (H) 12/25/2022   A1c Lab Results  Component Value Date   HGBA1C 5.3 12/10/2020    TSH Lab Results  Component Value Date   TSH 1.62 12/25/2022    Assessment & Plan:   Orders Placed This Encounter  Procedures   TSH   Hemoglobin A1c  BMI 32.0-32.9; Screening For Thyroid Disorder; Screening For Diabetes Mellitus: PMHx of Vitamin-D Deficiency, level 27 in December 2024 managed on 50,000 units weekly. Impaired Glucose Tolerance noted 2016 w/ 2022 HgbA1c 5.7, but since then has ranged 5.2-5.4, andn 12/2022 glucose 96. 12/2022 LDL 100, Triglycerides 157, and TSH 1.62. BMI 30+; since 2015, when she weighed 164 lbs, she has gained 18 pounds and today weighs 182 lbsBMI 33.29.  Discussed weight loss medications, their benefits and side effects, as well as weight loss clinics for more extensive management. I have provided her with weight loss clinics' information if she decides to go forwards with that option. Since she hasn't had an A1c since 2022 will order this today, and repeat TSH .  She is interested in GLP-1 medications for weight loss.  I have given her information regarding [facility] and [facility].  I think these would be reasonable options for her.  There are other weight loss centers in town that offer GLP-1 medication as well.  I will call her with results of lab testing within the next day or 2 when results are back.   I,Emily Lagle,acting as a Neurosurgeon for Ronal JINNY Hailstone, MD.,have documented all relevant documentation on the behalf of Ronal JINNY Hailstone, MD,as directed by  Ronal JINNY Hailstone, MD while in the presence of Ronal JINNY Hailstone, MD.  I, Ronal JINNY Hailstone, MD, have reviewed all documentation for this visit. The documentation on 08/27/2023 for the exam, diagnosis, procedures, and orders are all accurate and complete.

## 2023-08-27 ENCOUNTER — Ambulatory Visit (INDEPENDENT_AMBULATORY_CARE_PROVIDER_SITE_OTHER): Admitting: Internal Medicine

## 2023-08-27 ENCOUNTER — Encounter: Payer: Self-pay | Admitting: Internal Medicine

## 2023-08-27 VITALS — BP 100/80 | HR 86 | Ht 62.0 in | Wt 182.0 lb

## 2023-08-27 DIAGNOSIS — Z131 Encounter for screening for diabetes mellitus: Secondary | ICD-10-CM

## 2023-08-27 DIAGNOSIS — Z6832 Body mass index (BMI) 32.0-32.9, adult: Secondary | ICD-10-CM | POA: Diagnosis not present

## 2023-08-27 DIAGNOSIS — E559 Vitamin D deficiency, unspecified: Secondary | ICD-10-CM | POA: Diagnosis not present

## 2023-08-27 DIAGNOSIS — Z1329 Encounter for screening for other suspected endocrine disorder: Secondary | ICD-10-CM

## 2023-08-27 NOTE — Patient Instructions (Addendum)
 Her annual wellness visit is due here in December 2025.  I have given her information on [facility] as well as [facility].  TSH and hemoglobin A1c were checked today.

## 2023-08-28 LAB — HEMOGLOBIN A1C
Hgb A1c MFr Bld: 5.7 % — ABNORMAL HIGH (ref <5.7)
Mean Plasma Glucose: 117 mg/dL
eAG (mmol/L): 6.5 mmol/L

## 2023-08-28 LAB — TSH: TSH: 1.69 m[IU]/L

## 2023-08-29 ENCOUNTER — Ambulatory Visit: Payer: Self-pay | Admitting: Internal Medicine

## 2023-09-01 DIAGNOSIS — F419 Anxiety disorder, unspecified: Secondary | ICD-10-CM | POA: Diagnosis not present

## 2023-09-01 DIAGNOSIS — Z6372 Alcoholism and drug addiction in family: Secondary | ICD-10-CM | POA: Diagnosis not present

## 2023-09-01 DIAGNOSIS — Z6379 Other stressful life events affecting family and household: Secondary | ICD-10-CM | POA: Diagnosis not present

## 2023-09-01 DIAGNOSIS — Z630 Problems in relationship with spouse or partner: Secondary | ICD-10-CM | POA: Diagnosis not present

## 2023-09-08 DIAGNOSIS — Z6379 Other stressful life events affecting family and household: Secondary | ICD-10-CM | POA: Diagnosis not present

## 2023-09-08 DIAGNOSIS — F419 Anxiety disorder, unspecified: Secondary | ICD-10-CM | POA: Diagnosis not present

## 2023-09-08 DIAGNOSIS — Z630 Problems in relationship with spouse or partner: Secondary | ICD-10-CM | POA: Diagnosis not present

## 2023-09-08 DIAGNOSIS — Z6372 Alcoholism and drug addiction in family: Secondary | ICD-10-CM | POA: Diagnosis not present

## 2023-09-15 DIAGNOSIS — Z6379 Other stressful life events affecting family and household: Secondary | ICD-10-CM | POA: Diagnosis not present

## 2023-09-15 DIAGNOSIS — F419 Anxiety disorder, unspecified: Secondary | ICD-10-CM | POA: Diagnosis not present

## 2023-09-15 DIAGNOSIS — Z630 Problems in relationship with spouse or partner: Secondary | ICD-10-CM | POA: Diagnosis not present

## 2023-09-15 DIAGNOSIS — Z6372 Alcoholism and drug addiction in family: Secondary | ICD-10-CM | POA: Diagnosis not present

## 2023-09-22 DIAGNOSIS — Z6372 Alcoholism and drug addiction in family: Secondary | ICD-10-CM | POA: Diagnosis not present

## 2023-09-22 DIAGNOSIS — Z630 Problems in relationship with spouse or partner: Secondary | ICD-10-CM | POA: Diagnosis not present

## 2023-09-22 DIAGNOSIS — Z6379 Other stressful life events affecting family and household: Secondary | ICD-10-CM | POA: Diagnosis not present

## 2023-09-22 DIAGNOSIS — F419 Anxiety disorder, unspecified: Secondary | ICD-10-CM | POA: Diagnosis not present

## 2023-09-29 DIAGNOSIS — Z6372 Alcoholism and drug addiction in family: Secondary | ICD-10-CM | POA: Diagnosis not present

## 2023-09-29 DIAGNOSIS — F419 Anxiety disorder, unspecified: Secondary | ICD-10-CM | POA: Diagnosis not present

## 2023-09-29 DIAGNOSIS — Z630 Problems in relationship with spouse or partner: Secondary | ICD-10-CM | POA: Diagnosis not present

## 2023-09-29 DIAGNOSIS — Z6379 Other stressful life events affecting family and household: Secondary | ICD-10-CM | POA: Diagnosis not present

## 2023-11-03 DIAGNOSIS — Z6379 Other stressful life events affecting family and household: Secondary | ICD-10-CM | POA: Diagnosis not present

## 2023-11-03 DIAGNOSIS — Z630 Problems in relationship with spouse or partner: Secondary | ICD-10-CM | POA: Diagnosis not present

## 2023-11-03 DIAGNOSIS — F419 Anxiety disorder, unspecified: Secondary | ICD-10-CM | POA: Diagnosis not present

## 2023-11-03 DIAGNOSIS — Z6372 Alcoholism and drug addiction in family: Secondary | ICD-10-CM | POA: Diagnosis not present

## 2023-11-24 DIAGNOSIS — Z630 Problems in relationship with spouse or partner: Secondary | ICD-10-CM | POA: Diagnosis not present

## 2023-11-24 DIAGNOSIS — F419 Anxiety disorder, unspecified: Secondary | ICD-10-CM | POA: Diagnosis not present

## 2023-11-24 DIAGNOSIS — Z6379 Other stressful life events affecting family and household: Secondary | ICD-10-CM | POA: Diagnosis not present

## 2023-11-24 DIAGNOSIS — Z6372 Alcoholism and drug addiction in family: Secondary | ICD-10-CM | POA: Diagnosis not present

## 2023-12-08 DIAGNOSIS — F419 Anxiety disorder, unspecified: Secondary | ICD-10-CM | POA: Diagnosis not present

## 2023-12-08 DIAGNOSIS — Z630 Problems in relationship with spouse or partner: Secondary | ICD-10-CM | POA: Diagnosis not present

## 2023-12-08 DIAGNOSIS — Z6379 Other stressful life events affecting family and household: Secondary | ICD-10-CM | POA: Diagnosis not present

## 2023-12-08 DIAGNOSIS — Z6372 Alcoholism and drug addiction in family: Secondary | ICD-10-CM | POA: Diagnosis not present

## 2023-12-11 ENCOUNTER — Other Ambulatory Visit: Payer: Self-pay | Admitting: Internal Medicine

## 2023-12-21 NOTE — Progress Notes (Signed)
 "  Annual Comprehensive Physical Exam   Patient Care Team: Perri Ronal PARAS, MD as PCP - General (Internal Medicine)  Visit Date: 01/03/2024   Chief Complaint  Patient presents with   Annual Exam   Subjective:  Patient: Brooke Bryan, Female DOB: June 26, 1975, 48 y.o. MRN: [id number] Vitals:   01/03/24 1454  BP: 120/80   Brooke Bryan is a 48 y.o. Female who presents today for her Annual Comprehensive Physical Exam. Patient has Obesity; Type 2 HSV infection of vulvovaginal region; Vitamin D  deficiency; Prediabetes; and Class 1 obesity on their problem list.  She was last seen here on 12/24/2023 for a lower respiratory infection. She said the symptoms have lessened but still remain.      She has been a patient in this practice since she was 48 years old.  History of breast reduction surgery in 2020.  She is followed also at [facility] for GYN care.   Elevated LDL of 119, She is agreeable to taking statin medication.    No known drug allergies.   History of chickenpox in 16.  History of recurrent urinary infections in childhood.  LEEP procedure in 2002 for abnormal Pap smear.  History of Herpes simplex type II.  History of vertigo in May 2016 that resolved with conservative therapy.  Strep pharyngitis in March 2018 and July 2017.    History of mild glucose intolerance some 6 or 7 years ago with hemoglobin A1c 5.7% but for the past several years hemoglobin A1c has been stable and was 5.3% in 2022. 12/30/2023 HgbA1c 5.6%   History of Vitamin D  deficiency treated with Vitamin D  1.25 mg. 12/30/2023 Vitamin D  25 Hydroxy 54.     Labs 12/30/2023 LDL 119, Otherwise WNL.    12/29/2022 Mammogram No mammographic evidence of malignancy repeat in one year.    03/26/2023 Colonoscopy One 4 mm polyp in the transverse colon. Resected and retrieved. Pathology fund to be benign colonic mucosa with Lymphoid aggregate. Diverticulosis in the sigmoid colon. Non- bleeding external and internal  hemorrhoids. Repeat in 10 years.    Vaccine counseling: Influenza vaccine due.  Hepatitis B vaccines deferred.    Health maintenance: She will be receiving her mammogram tomorrow.  Health Maintenance  Topic Date Due   Influenza Vaccine  08/20/2023   Mammogram  01/04/2024 (Originally 12/29/2023)   Hepatitis B Vaccines 19-59 Average Risk (1 of 3 - 19+ 3-dose series) 01/02/2025 (Originally 11/24/1996)   Cervical Cancer Screening (HPV/Pap Cotest)  12/15/2027   DTaP/Tdap/Td (4 - Td or Tdap) 02/27/2029   Colonoscopy  04/14/2033   Pneumococcal Vaccine  Aged Out   HPV VACCINES  Aged Out   Meningococcal B Vaccine  Aged Out   COVID-19 Vaccine  Discontinued   Hepatitis C Screening  Discontinued   HIV Screening  Discontinued   Review of Systems  Constitutional:  Negative for fever and malaise/fatigue.  HENT:  Negative for congestion.   Eyes:  Negative for blurred vision.  Respiratory:  Negative for cough and shortness of breath.   Cardiovascular:  Negative for chest pain, palpitations and leg swelling.  Gastrointestinal:  Negative for vomiting.  Musculoskeletal:  Negative for back pain.  Skin:  Negative for rash.  Neurological:  Negative for loss of consciousness and headaches.   Objective:  Vitals: body mass index is 33.29 kg/m. Today's Vitals   01/03/24 1454  BP: 120/80  Pulse: 94  SpO2: 97%  Weight: 182 lb (82.6 kg)  Height: 5' 2 (1.575 m)  PainSc:  0-No pain   Physical Exam Vitals and nursing note reviewed.  Constitutional:      General: She is not in acute distress.    Appearance: Normal appearance. She is not ill-appearing or toxic-appearing.  HENT:     Head: Normocephalic and atraumatic.     Right Ear: Hearing, tympanic membrane, ear canal and external ear normal.     Left Ear: Hearing, tympanic membrane, ear canal and external ear normal.     Mouth/Throat:     Pharynx: Oropharynx is clear.  Eyes:     Extraocular Movements: Extraocular movements intact.     Pupils:  Pupils are equal, round, and reactive to light.  Neck:     Thyroid : No thyroid  mass, thyromegaly or thyroid  tenderness.     Vascular: No carotid bruit.  Cardiovascular:     Rate and Rhythm: Normal rate and regular rhythm. No extrasystoles are present.    Pulses:          Dorsalis pedis pulses are 2+ on the right side and 2+ on the left side.     Heart sounds: Normal heart sounds. No murmur heard.    No friction rub. No gallop.  Pulmonary:     Effort: Pulmonary effort is normal.     Breath sounds: Normal breath sounds. No decreased breath sounds, wheezing, rhonchi or rales.  Chest:     Chest wall: No mass.  Abdominal:     Palpations: Abdomen is soft. There is no hepatomegaly, splenomegaly or mass.     Tenderness: There is no abdominal tenderness.     Hernia: No hernia is present.  Genitourinary:    Comments: Pelvic exam deferred to GYN.  Musculoskeletal:     Cervical back: Normal range of motion.     Right lower leg: No edema.     Left lower leg: No edema.  Lymphadenopathy:     Cervical: No cervical adenopathy.     Upper Body:     Right upper body: No supraclavicular adenopathy.     Left upper body: No supraclavicular adenopathy.  Skin:    General: Skin is warm and dry.  Neurological:     General: No focal deficit present.     Mental Status: She is alert and oriented to person, place, and time. Mental status is at baseline.     Sensory: Sensation is intact.     Motor: Motor function is intact. No weakness.     Deep Tendon Reflexes: Reflexes are normal and symmetric.  Psychiatric:        Attention and Perception: Attention normal.        Mood and Affect: Mood normal.        Speech: Speech normal.        Behavior: Behavior normal.        Thought Content: Thought content normal.        Cognition and Memory: Cognition normal.        Judgment: Judgment normal.     Current Outpatient Medications  Medication Instructions   HYDROcodone  bit-homatropine (HYCODAN) 5-1.5 MG/5ML  syrup 5 mLs, Oral, Every 8 hours PRN   Multiple Vitamin (MULTIVITAMIN) tablet 1 tablet, Daily   rosuvastatin  (CRESTOR ) 5 mg, Oral, Daily   valACYclovir  (VALTREX ) 1000 MG tablet One tab 3 times daily for 7 days for Herpes zosterOne tab 3 times daily for 7 days for Herpes zoster   Vitamin D , Ergocalciferol , (DRISDOL ) 1.25 MG (50000 UNIT) CAPS capsule TAKE 1 CAPSULE BY MOUTH ONE TIME PER WEEK  Past Medical History:  Diagnosis Date   Anxiety    Fatigue    Herpes simplex virus type 1 (HSV-1) dermatitis    History of abnormal Pap smear    Pre-diabetes    Vitamin D  deficiency    Medical/Surgical History Narrative:  Allergic/Intolerant to: No Known Allergies  Past Surgical History:  Procedure Laterality Date   BREAST REDUCTION SURGERY     LEEP  01/20/2000   Dr. LELON Moats   Family History  Problem Relation Age of Onset   Thyroid  disease Mother    Cancer Mother    Alcoholism Mother    Hypertension Father    Hyperlipidemia Father    AAA (abdominal aortic aneurysm) Father    Cancer Maternal Grandmother    Colon cancer Neg Hx    Esophageal cancer Neg Hx    Rectal cancer Neg Hx    Stomach cancer Neg Hx    Family History Narrative:   She is married.  She has 3 children, 2 sons and a daughter.  1 son has translocation of chromosomes 1 and 27.  She does not smoke.  Social alcohol consumption.  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  She attended [employer] and majoring in chief financial officer.  1 son has had addiction issues, and has been in rehab since around Thanksgiving, which has been stressful.    Family history: Father with history of hyperlipidemia, hypertension and hip arthroplasty. 1 brother in good health. Mother with history of breast cancer as well as maternal grandmother.   Most Recent Health Risks Assessment:   Most Recent Social Determinants of Health (Including Hx of Tobacco, Alcohol, and Drug Use) SDOH Screenings   Food Insecurity: No Food  Insecurity (01/01/2023)  Housing: Low Risk (01/01/2023)  Transportation Needs: No Transportation Needs (01/01/2023)  Utilities: Not At Risk (01/01/2023)  Depression (PHQ2-9): Medium Risk (01/03/2024)  Tobacco Use: Low Risk (01/03/2024)  Health Literacy: Adequate Health Literacy (01/01/2023)   Social History   Tobacco Use   Smoking status: Never   Smokeless tobacco: Never  Vaping Use   Vaping status: Never Used  Substance Use Topics   Alcohol use: Yes    Comment: rarely   Drug use: No    Most Recent Fall Risk Assessment:    06/04/2022   12:31 PM  Fall Risk   Falls in the past year? 0  Number falls in past yr: 0  Injury with Fall? 0   Risk for fall due to : No Fall Risks  Follow up Falls prevention discussed     Data saved with a previous flowsheet row definition   Most Recent Anxiety/Depression Screenings:    01/03/2024    2:55 PM 08/27/2023   12:07 PM  PHQ 2/9 Scores  PHQ - 2 Score 2 0  PHQ- 9 Score 8       01/03/2024    2:55 PM  GAD 7 : Generalized Anxiety Score  Nervous, Anxious, on Edge 1  Control/stop worrying 0  Worry too much - different things 0  Trouble relaxing 0  Restless 0  Easily annoyed or irritable 0  Afraid - awful might happen 0  Total GAD 7 Score 1  Anxiety Difficulty Somewhat difficult    Results:  Studies Obtained And Personally Reviewed By Me: 12/29/2022 Mammogram No mammographic evidence of malignancy repeat in one year.    03/26/2023 Colonoscopy One 4 mm polyp in the transverse colon. Resected and retrieved. Pathology fund to be  benign colonic mucosa with Lymphoid aggregate. Diverticulosis in the sigmoid colon. Non- bleeding external and internal hemorrhoids. Repeat in 10 years.   Labs:  CBC w/ Differential Lab Results  Component Value Date   WBC 5.9 12/30/2023   RBC 4.99 12/30/2023   HGB 13.8 12/30/2023   HCT 43.6 12/30/2023   PLT 330 12/30/2023   MCV 87.4 12/30/2023   MCH 27.7 12/30/2023   MCHC 31.7 12/30/2023   RDW 13.1  12/30/2023   MPV 9.7 12/30/2023   LYMPHSABS 2,613 12/16/2021   MONOABS 0.4 07/12/2014   BASOSABS 71 12/30/2023    Comprehensive Metabolic Panel Lab Results  Component Value Date   NA 137 12/30/2023   K 4.8 12/30/2023   CL 103 12/30/2023   CO2 25 12/30/2023   GLUCOSE 94 12/30/2023   BUN 17 12/30/2023   CREATININE 0.96 12/30/2023   CALCIUM  9.1 12/30/2023   PROT 7.2 12/30/2023   ALBUMIN 4.5 07/12/2014   AST 16 12/30/2023   ALT 17 12/30/2023   ALKPHOS 56 07/12/2014   BILITOT 0.5 12/30/2023   EGFR 74 12/30/2023   GFRNONAA 92 02/24/2019   Lipid Panel  Lab Results  Component Value Date   CHOL 197 12/30/2023   HDL 53 12/30/2023   LDLCALC 119 (H) 12/30/2023   TRIG 135 12/30/2023   A1c Lab Results  Component Value Date   HGBA1C 5.6 12/30/2023    TSH Lab Results  Component Value Date   TSH 1.82 12/30/2023   Assessment & Plan:   Orders Placed This Encounter  Procedures   POCT URINALYSIS DIP (CLINITEK)   Meds ordered this encounter  Medications   rosuvastatin  (CRESTOR ) 5 MG tablet    Sig: Take 1 tablet (5 mg total) by mouth daily.    Dispense:  90 tablet    Refill:  3   Acute lower respiratory infection: She was last seen here on 12/24/2023 for a lower respiratory infection. She said the symptoms have lessened but still remain.      Rocephin  1 g IM injection received today.   She has been a patient in this practice since she was 48 years old.  History of breast reduction surgery in 2020.  She is followed also at [facility] for GYN care.   Elevated LDL of 119: She is agreeable to taking statin medication.    Rosuvastatin  5 mg daily prescribed.   Lipid Panel recheck in 3 months     mild glucose intolerance: some 6 or 7 years ago with hemoglobin A1c 5.7% but for the past several years hemoglobin A1c has been stable and was 5.3% in 2022. 12/30/2023 HgbA1c 5.6%   Vitamin D  deficiency: treated with Vitamin D  50,000 units weekly. 12/30/2023 Vitamin D  25 Hydroxy  54.     12/29/2022 Mammogram No mammographic evidence of malignancy repeat in one year.    03/26/2023 Colonoscopy One 4 mm polyp in the transverse colon. Resected and retrieved. Pathology fund to be benign colonic mucosa with Lymphoid aggregate. Diverticulosis in the sigmoid colon. Non- bleeding external and internal hemorrhoids. Repeat in 10 years.    Vaccine counseling: Influenza vaccine due. Hepatitis B vaccines deferred.    Health maintenance: She will be receiving her mammogram tomorrow.    Annual Comprehensive Physical Exam done today including the all of the following: Reviewed patient's Family Medical History Reviewed patient's SDOH and reviewed tobacco, alcohol, and drug use.  Reviewed and updated list of patient's medical providers Assessment of cognitive impairment was done Assessed patient's functional ability  Established a written schedule for health screening services Health Risk Assessent Completed and Reviewed  Discussed health benefits of physical activity, and encouraged her to engage in regular exercise appropriate for her age and condition.   I,Makayla C Reid,acting as a scribe for Ronal JINNY Hailstone, MD.,have documented all relevant documentation on the behalf of Ronal JINNY Hailstone, MD,as directed by  Ronal JINNY Hailstone, MD while in the presence of Ronal JINNY Hailstone, MD.  I, Ronal JINNY Hailstone, MD, have reviewed all documentation for and agree with the above Annual Wellness Visit documentation.  Ronal JINNY Hailstone, MD Internal Medicine 01/03/2024 "

## 2023-12-22 DIAGNOSIS — Z6372 Alcoholism and drug addiction in family: Secondary | ICD-10-CM | POA: Diagnosis not present

## 2023-12-22 DIAGNOSIS — Z630 Problems in relationship with spouse or partner: Secondary | ICD-10-CM | POA: Diagnosis not present

## 2023-12-22 DIAGNOSIS — F419 Anxiety disorder, unspecified: Secondary | ICD-10-CM | POA: Diagnosis not present

## 2023-12-22 DIAGNOSIS — Z6379 Other stressful life events affecting family and household: Secondary | ICD-10-CM | POA: Diagnosis not present

## 2023-12-24 ENCOUNTER — Encounter: Payer: Self-pay | Admitting: Internal Medicine

## 2023-12-24 ENCOUNTER — Telehealth: Payer: Self-pay | Admitting: Internal Medicine

## 2023-12-24 ENCOUNTER — Ambulatory Visit: Admitting: Internal Medicine

## 2023-12-24 VITALS — BP 130/80 | HR 106 | Temp 99.6°F | Ht 62.0 in | Wt 175.0 lb

## 2023-12-24 DIAGNOSIS — J22 Unspecified acute lower respiratory infection: Secondary | ICD-10-CM | POA: Diagnosis not present

## 2023-12-24 DIAGNOSIS — R509 Fever, unspecified: Secondary | ICD-10-CM | POA: Diagnosis not present

## 2023-12-24 DIAGNOSIS — Z20828 Contact with and (suspected) exposure to other viral communicable diseases: Secondary | ICD-10-CM

## 2023-12-24 DIAGNOSIS — R059 Cough, unspecified: Secondary | ICD-10-CM

## 2023-12-24 DIAGNOSIS — R0981 Nasal congestion: Secondary | ICD-10-CM

## 2023-12-24 DIAGNOSIS — R5383 Other fatigue: Secondary | ICD-10-CM | POA: Diagnosis not present

## 2023-12-24 LAB — POC COVID19/FLU A&B COMBO
Covid Antigen, POC: NEGATIVE
Influenza A Antigen, POC: NEGATIVE
Influenza B Antigen, POC: NEGATIVE

## 2023-12-24 LAB — POCT RESPIRATORY SYNCYTIAL VIRUS: RSV Rapid Ag: NEGATIVE

## 2023-12-24 MED ORDER — HYDROCODONE BIT-HOMATROP MBR 5-1.5 MG/5ML PO SOLN: 5.0000 mL | Freq: Three times a day (TID) | ORAL | 0 refills | Status: AC | PRN

## 2023-12-24 MED ORDER — AZITHROMYCIN 250 MG PO TABS: ORAL_TABLET | ORAL | 0 refills | Status: AC

## 2023-12-24 NOTE — Progress Notes (Signed)
 Patient Care Team: Perri Ronal PARAS, MD as PCP - General (Internal Medicine)  Visit Date: 12/24/23  Subjective:    Patient ID: Brooke Bryan , Female   DOB: [birth year], 48 y.o.    MRN: [id number]   48 y.o. Female presents today for cough. Patient has a past medical history of anxiety, fatigue, HSV-1, abnormal Pap smear, pre-diabetes, Vitamin D  deficiency.  She started to get sick on Sunday. She said that is started with a low grade fever and then she developed a cough. A guest at her thanksgiving dinner was recently hospitalized with RSV and several other members of her family are sick. She says when she coughs her lungs burn. Influenza, Covid-19 and RSV tests were negative.   Vaccine counseling: Influenza vaccine due.      Past Medical History:  Diagnosis Date   Anxiety    Fatigue    Herpes simplex virus type 1 (HSV-1) dermatitis    History of abnormal Pap smear    Pre-diabetes    Vitamin D  deficiency      Family History  Problem Relation Age of Onset   Thyroid  disease Mother    Cancer Mother    Alcoholism Mother    Hypertension Father    Hyperlipidemia Father    AAA (abdominal aortic aneurysm) Father    Cancer Maternal Grandmother    Colon cancer Neg Hx    Esophageal cancer Neg Hx    Rectal cancer Neg Hx    Stomach cancer Neg Hx       Review of Systems  Constitutional:  Positive for fever and malaise/fatigue.  HENT:  Positive for congestion.   Respiratory:  Positive for cough.         Objective:   Vitals: BP 130/80   Pulse (!) 106   Temp 99.6 F (37.6 C)   Ht 5' 2 (1.575 m)   Wt 175 lb (79.4 kg)   LMP 11/29/2023   SpO2 97%   BMI 32.01 kg/m    Physical Exam Vitals and nursing note reviewed.  Constitutional:      General: She is not in acute distress.    Appearance: Normal appearance. She is not ill-appearing.  HENT:     Head: Normocephalic and atraumatic.     Right Ear: Tympanic membrane, ear canal and external ear normal.     Left Ear:  Tympanic membrane, ear canal and external ear normal.     Ears:     Comments: Right ear dull.  Left ear pink and dull.     Mouth/Throat:     Mouth: Mucous membranes are moist.     Pharynx: Oropharynx is clear. Posterior oropharyngeal erythema present. No oropharyngeal exudate.  Pulmonary:     Effort: Pulmonary effort is normal.     Breath sounds: Normal breath sounds. No wheezing, rhonchi or rales.  Lymphadenopathy:     Cervical: No cervical adenopathy.  Skin:    General: Skin is warm and dry.  Neurological:     Mental Status: She is alert and oriented to person, place, and time. Mental status is at baseline.  Psychiatric:        Mood and Affect: Mood normal.        Behavior: Behavior normal.        Thought Content: Thought content normal.        Judgment: Judgment normal.       Results:    Labs:       Component Value Date/Time  NA 139 12/25/2022 0909   K 4.9 12/25/2022 0909   CL 104 12/25/2022 0909   CO2 25 12/25/2022 0909   GLUCOSE 96 12/25/2022 0909   BUN 13 12/25/2022 0909   CREATININE 0.83 12/25/2022 0909   CALCIUM 9.2 12/25/2022 0909   PROT 7.1 12/25/2022 0909   ALBUMIN 4.5 07/12/2014 0941   AST 16 12/25/2022 0909   ALT 14 12/25/2022 0909   ALKPHOS 56 07/12/2014 0941   BILITOT 0.5 12/25/2022 0909   GFRNONAA 92 02/24/2019 0938   GFRAA 106 02/24/2019 0938     Lab Results  Component Value Date   WBC 6.4 12/25/2022   HGB 13.4 12/25/2022   HCT 42.1 12/25/2022   MCV 90.3 12/25/2022   PLT 268 12/25/2022    Lab Results  Component Value Date   CHOL 187 12/25/2022   HDL 62 12/25/2022   LDLCALC 100 (H) 12/25/2022   TRIG 157 (H) 12/25/2022   CHOLHDL 3.0 12/25/2022    Lab Results  Component Value Date   HGBA1C 5.7 (H) 08/27/2023     Lab Results  Component Value Date   TSH 1.69 08/27/2023        Assessment & Plan:   Meds ordered this encounter  Medications   HYDROcodone  bit-homatropine (HYCODAN) 5-1.5 MG/5ML syrup    Sig: Take 5 mLs by  mouth every 8 (eight) hours as needed for cough.    Dispense:  120 mL    Refill:  0   azithromycin  (ZITHROMAX ) 250 MG tablet    Sig: Take 2 tablets on day 1, then 1 tablet daily on days 2 through 5    Dispense:  6 tablet    Refill:  0   Orders Placed This Encounter  Procedures   POC Covid19/Flu A&B Antigen   POCT respiratory syncytial virus    Acute lower respiratory infection: She started to get sick on Sunday. She said that is started with a low grade fever and then she developed a cough. A guest at her thanksgiving dinner was recently hospitalized with RSV and several other members of her family are sick. She says when she coughs her lungs burn. Influenza, Covid-19 and RSV tests were all negative.    Azithromycin  250 mg 2 tablets on day one, one tablet daily on days 2-5 prescribed.   Hycodan 5 ml every 8 hours as needed for cough prescribed.   Vaccine counseling: Influenza vaccine due.     I,Makayla C Reid,acting as a scribe for Ronal JINNY Hailstone, MD.,have documented all relevant documentation on the behalf of Ronal JINNY Hailstone, MD,as directed by  Ronal JINNY Hailstone, MD while in the presence of Ronal JINNY Hailstone, MD.

## 2023-12-24 NOTE — Patient Instructions (Addendum)
 We are sorry you are not feeling well today.  You have been diagnosed with an acute lower respiratory infection.  Please take Zithromax  Z-PAK generic 2 tabs on day 1 followed by 1 tab days 2 through 5.  May take Hycodan 1 teaspoon every 8 hours as needed for cough.  Rest and stay well-hydrated.  Call if not better in 5 to 7 days or sooner if worse.  You have tested negative for influenza, COVID-19 and RSV today.

## 2023-12-29 DIAGNOSIS — Z6372 Alcoholism and drug addiction in family: Secondary | ICD-10-CM | POA: Diagnosis not present

## 2023-12-29 DIAGNOSIS — Z6379 Other stressful life events affecting family and household: Secondary | ICD-10-CM | POA: Diagnosis not present

## 2023-12-29 DIAGNOSIS — F419 Anxiety disorder, unspecified: Secondary | ICD-10-CM | POA: Diagnosis not present

## 2023-12-29 DIAGNOSIS — Z630 Problems in relationship with spouse or partner: Secondary | ICD-10-CM | POA: Diagnosis not present

## 2023-12-30 ENCOUNTER — Other Ambulatory Visit: Payer: BC Managed Care – PPO

## 2023-12-30 DIAGNOSIS — E559 Vitamin D deficiency, unspecified: Secondary | ICD-10-CM | POA: Diagnosis not present

## 2023-12-30 DIAGNOSIS — Z13220 Encounter for screening for lipoid disorders: Secondary | ICD-10-CM | POA: Diagnosis not present

## 2023-12-30 DIAGNOSIS — Z0000 Encounter for general adult medical examination without abnormal findings: Secondary | ICD-10-CM | POA: Diagnosis not present

## 2023-12-30 DIAGNOSIS — R7302 Impaired glucose tolerance (oral): Secondary | ICD-10-CM | POA: Diagnosis not present

## 2023-12-30 DIAGNOSIS — Z1329 Encounter for screening for other suspected endocrine disorder: Secondary | ICD-10-CM

## 2023-12-31 LAB — LIPID PANEL
Cholesterol: 197 mg/dL (ref <200)
HDL: 53 mg/dL (ref >=50)
LDL Cholesterol (Calc): 119 mg/dL — ABNORMAL HIGH
Non-HDL Cholesterol (Calc): 144 mg/dL — ABNORMAL HIGH (ref <130)
Total CHOL/HDL Ratio: 3.7 (calc) (ref <5.0)
Triglycerides: 135 mg/dL (ref <150)

## 2023-12-31 LAB — COMPREHENSIVE METABOLIC PANEL WITH GFR
AG Ratio: 1.7 (calc) (ref 1.0–2.5)
ALT: 17 U/L (ref 6–29)
AST: 16 U/L (ref 10–35)
Albumin: 4.5 g/dL (ref 3.6–5.1)
Alkaline phosphatase (APISO): 65 U/L (ref 31–125)
BUN: 17 mg/dL (ref 7–25)
CO2: 25 mmol/L (ref 20–32)
Calcium: 9.1 mg/dL (ref 8.6–10.2)
Chloride: 103 mmol/L (ref 98–110)
Creat: 0.96 mg/dL (ref 0.50–0.99)
Globulin: 2.7 g/dL (ref 1.9–3.7)
Glucose, Bld: 94 mg/dL (ref 65–99)
Potassium: 4.8 mmol/L (ref 3.5–5.3)
Sodium: 137 mmol/L (ref 135–146)
Total Bilirubin: 0.5 mg/dL (ref 0.2–1.2)
Total Protein: 7.2 g/dL (ref 6.1–8.1)
eGFR: 74 mL/min/1.73m2 (ref >=60)

## 2023-12-31 LAB — CBC WITH DIFFERENTIAL/PLATELET
Absolute Lymphocytes: 2643 {cells}/uL (ref 850–3900)
Absolute Monocytes: 401 {cells}/uL (ref 200–950)
Basophils Absolute: 71 {cells}/uL (ref 0–200)
Basophils Relative: 1.2 %
Eosinophils Absolute: 112 {cells}/uL (ref 15–500)
Eosinophils Relative: 1.9 %
HCT: 43.6 % (ref 35.9–46.0)
Hemoglobin: 13.8 g/dL (ref 11.7–15.5)
MCH: 27.7 pg (ref 27.0–33.0)
MCHC: 31.7 g/dL (ref 31.6–35.4)
MCV: 87.4 fL (ref 81.4–101.7)
MPV: 9.7 fL (ref 7.5–12.5)
Monocytes Relative: 6.8 %
Neutro Abs: 2673 {cells}/uL (ref 1500–7800)
Neutrophils Relative %: 45.3 %
Platelets: 330 Thousand/uL (ref 140–400)
RBC: 4.99 Million/uL (ref 3.80–5.10)
RDW: 13.1 % (ref 11.0–15.0)
Total Lymphocyte: 44.8 %
WBC: 5.9 Thousand/uL (ref 3.8–10.8)

## 2023-12-31 LAB — HEMOGLOBIN A1C
Hgb A1c MFr Bld: 5.6 % (ref <5.7)
Mean Plasma Glucose: 114 mg/dL
eAG (mmol/L): 6.3 mmol/L

## 2023-12-31 LAB — TSH: TSH: 1.82 m[IU]/L

## 2023-12-31 LAB — VITAMIN D 25 HYDROXY (VIT D DEFICIENCY, FRACTURES): Vit D, 25-Hydroxy: 54 ng/mL (ref 30–100)

## 2024-01-03 ENCOUNTER — Ambulatory Visit (INDEPENDENT_AMBULATORY_CARE_PROVIDER_SITE_OTHER): Payer: BC Managed Care – PPO | Admitting: Internal Medicine

## 2024-01-03 ENCOUNTER — Encounter: Payer: Self-pay | Admitting: Internal Medicine

## 2024-01-03 VITALS — BP 120/80 | HR 94 | Ht 62.0 in | Wt 182.0 lb

## 2024-01-03 DIAGNOSIS — J22 Unspecified acute lower respiratory infection: Secondary | ICD-10-CM

## 2024-01-03 DIAGNOSIS — Z6833 Body mass index (BMI) 33.0-33.9, adult: Secondary | ICD-10-CM

## 2024-01-03 DIAGNOSIS — E7800 Pure hypercholesterolemia, unspecified: Secondary | ICD-10-CM | POA: Diagnosis not present

## 2024-01-03 DIAGNOSIS — Z0000 Encounter for general adult medical examination without abnormal findings: Secondary | ICD-10-CM

## 2024-01-03 DIAGNOSIS — Z9889 Other specified postprocedural states: Secondary | ICD-10-CM

## 2024-01-03 DIAGNOSIS — R0981 Nasal congestion: Secondary | ICD-10-CM

## 2024-01-03 DIAGNOSIS — Z803 Family history of malignant neoplasm of breast: Secondary | ICD-10-CM

## 2024-01-03 DIAGNOSIS — E7439 Other disorders of intestinal carbohydrate absorption: Secondary | ICD-10-CM | POA: Diagnosis not present

## 2024-01-03 DIAGNOSIS — E559 Vitamin D deficiency, unspecified: Secondary | ICD-10-CM

## 2024-01-03 LAB — POCT URINALYSIS DIP (CLINITEK)
Bilirubin, UA: NEGATIVE
Blood, UA: NEGATIVE
Glucose, UA: NEGATIVE mg/dL
Ketones, POC UA: NEGATIVE mg/dL
Leukocytes, UA: NEGATIVE
Nitrite, UA: NEGATIVE
POC PROTEIN,UA: NEGATIVE
Spec Grav, UA: 1.01 (ref 1.010–1.025)
Urobilinogen, UA: 0.2 U/dL
pH, UA: 6.5 (ref 5.0–8.0)

## 2024-01-03 MED ORDER — ROSUVASTATIN CALCIUM 5 MG PO TABS: 5.0000 mg | ORAL_TABLET | Freq: Every day | ORAL | 3 refills | Status: AC

## 2024-01-03 MED ORDER — CEFTRIAXONE SODIUM 1 G IJ SOLR
1.0000 g | Freq: Once | INTRAMUSCULAR | Status: AC
  Administered 2024-01-03: 17:00:00 1 g via INTRAMUSCULAR

## 2024-01-03 NOTE — Telephone Encounter (Signed)
 Done

## 2024-01-04 DIAGNOSIS — Z1231 Encounter for screening mammogram for malignant neoplasm of breast: Secondary | ICD-10-CM | POA: Diagnosis not present

## 2024-01-04 LAB — HM MAMMOGRAPHY

## 2024-01-05 DIAGNOSIS — Z6372 Alcoholism and drug addiction in family: Secondary | ICD-10-CM | POA: Diagnosis not present

## 2024-01-05 DIAGNOSIS — F419 Anxiety disorder, unspecified: Secondary | ICD-10-CM | POA: Diagnosis not present

## 2024-01-05 DIAGNOSIS — Z6379 Other stressful life events affecting family and household: Secondary | ICD-10-CM | POA: Diagnosis not present

## 2024-01-05 DIAGNOSIS — Z630 Problems in relationship with spouse or partner: Secondary | ICD-10-CM | POA: Diagnosis not present

## 2024-01-06 ENCOUNTER — Encounter: Payer: Self-pay | Admitting: Internal Medicine

## 2024-01-13 ENCOUNTER — Encounter: Payer: Self-pay | Admitting: Internal Medicine

## 2024-01-13 NOTE — Patient Instructions (Addendum)
 Continue Vitamin D  supplement weekly. Hemoglobin AIC is stable at 5.7%.Take Crestor  5 mg daily and return in 3 months for follow up lipid panel with office visit. Reminded pt about flu vaccine. Have mammogram tomorrow. One gram IM rocephin  given today for persistent respiratory infection symptoms. Recently prescribed Z-pak.

## 2024-02-22 ENCOUNTER — Encounter: Payer: Self-pay | Admitting: Internal Medicine
